# Patient Record
Sex: Female | Born: 1950
Health system: Southern US, Community
[De-identification: ages and names within clinical notes are randomized; demographics above are authoritative.]

## PROBLEM LIST (undated history)

## (undated) DIAGNOSIS — J449 Chronic obstructive pulmonary disease, unspecified: Secondary | ICD-10-CM

## (undated) DIAGNOSIS — I10 Essential (primary) hypertension: Secondary | ICD-10-CM

## (undated) DIAGNOSIS — E119 Type 2 diabetes mellitus without complications: Secondary | ICD-10-CM

## (undated) DIAGNOSIS — I4819 Other persistent atrial fibrillation: Secondary | ICD-10-CM

## (undated) DIAGNOSIS — D649 Anemia, unspecified: Secondary | ICD-10-CM

## (undated) DIAGNOSIS — I219 Acute myocardial infarction, unspecified: Secondary | ICD-10-CM

## (undated) DIAGNOSIS — I5042 Chronic combined systolic (congestive) and diastolic (congestive) heart failure: Secondary | ICD-10-CM

## (undated) DIAGNOSIS — F419 Anxiety disorder, unspecified: Secondary | ICD-10-CM

## (undated) DIAGNOSIS — K219 Gastro-esophageal reflux disease without esophagitis: Secondary | ICD-10-CM

## (undated) DIAGNOSIS — I2510 Atherosclerotic heart disease of native coronary artery without angina pectoris: Secondary | ICD-10-CM

## (undated) DIAGNOSIS — I639 Cerebral infarction, unspecified: Secondary | ICD-10-CM

## (undated) HISTORY — PX: NM MYOVIEW LTD: HXRAD82

## (undated) HISTORY — DX: Chronic obstructive pulmonary disease, unspecified: J44.9

## (undated) HISTORY — DX: Other persistent atrial fibrillation: I48.19

## (undated) HISTORY — PX: CARDIAC CATHETERIZATION: SHX172

## (undated) HISTORY — DX: Acute myocardial infarction, unspecified: I21.9

## (undated) HISTORY — DX: Gastro-esophageal reflux disease without esophagitis: K21.9

## (undated) HISTORY — DX: Essential (primary) hypertension: I10

## (undated) HISTORY — DX: Anemia, unspecified: D64.9

## (undated) HISTORY — DX: Anxiety disorder, unspecified: F41.9

## (undated) HISTORY — PX: OTHER SURGICAL HISTORY: SHX169

## (undated) HISTORY — DX: Cerebral infarction, unspecified: I63.9

## (undated) HISTORY — DX: Atherosclerotic heart disease of native coronary artery without angina pectoris: I25.10

## (undated) HISTORY — DX: Type 2 diabetes mellitus without complications: E11.9

## (undated) HISTORY — DX: Chronic combined systolic (congestive) and diastolic (congestive) heart failure: I50.42

---

## 2004-03-23 DIAGNOSIS — I2510 Atherosclerotic heart disease of native coronary artery without angina pectoris: Secondary | ICD-10-CM

## 2004-03-23 DIAGNOSIS — I639 Cerebral infarction, unspecified: Secondary | ICD-10-CM

## 2004-03-23 DIAGNOSIS — I219 Acute myocardial infarction, unspecified: Secondary | ICD-10-CM

## 2004-03-23 HISTORY — DX: Cerebral infarction, unspecified: I63.9

## 2004-03-23 HISTORY — DX: Atherosclerotic heart disease of native coronary artery without angina pectoris: I25.10

## 2004-03-23 HISTORY — DX: Acute myocardial infarction, unspecified: I21.9

## 2004-08-14 ENCOUNTER — Other Ambulatory Visit: Payer: Self-pay

## 2004-08-15 ENCOUNTER — Inpatient Hospital Stay: Payer: Self-pay | Admitting: Internal Medicine

## 2004-08-19 ENCOUNTER — Other Ambulatory Visit: Payer: Self-pay

## 2004-08-20 ENCOUNTER — Other Ambulatory Visit: Payer: Self-pay

## 2005-02-05 ENCOUNTER — Other Ambulatory Visit: Payer: Self-pay

## 2005-02-06 ENCOUNTER — Inpatient Hospital Stay: Payer: Self-pay | Admitting: Internal Medicine

## 2005-02-06 ENCOUNTER — Other Ambulatory Visit: Payer: Self-pay

## 2005-02-10 HISTORY — PX: CORONARY ANGIOPLASTY WITH STENT PLACEMENT: SHX49

## 2005-10-13 ENCOUNTER — Ambulatory Visit: Payer: Self-pay

## 2005-11-18 ENCOUNTER — Ambulatory Visit: Payer: Self-pay | Admitting: Vascular Surgery

## 2005-11-25 ENCOUNTER — Ambulatory Visit: Payer: Self-pay | Admitting: Gastroenterology

## 2006-01-21 DIAGNOSIS — D126 Benign neoplasm of colon, unspecified: Secondary | ICD-10-CM | POA: Insufficient documentation

## 2006-02-14 ENCOUNTER — Other Ambulatory Visit: Payer: Self-pay

## 2006-02-15 ENCOUNTER — Inpatient Hospital Stay: Payer: Self-pay | Admitting: Internal Medicine

## 2006-02-16 ENCOUNTER — Other Ambulatory Visit: Payer: Self-pay

## 2006-02-18 LAB — HM COLONOSCOPY

## 2011-08-22 DIAGNOSIS — N289 Disorder of kidney and ureter, unspecified: Secondary | ICD-10-CM | POA: Insufficient documentation

## 2011-09-11 ENCOUNTER — Ambulatory Visit: Payer: Self-pay | Admitting: Family Medicine

## 2011-09-18 ENCOUNTER — Ambulatory Visit: Payer: Self-pay | Admitting: Family Medicine

## 2013-10-27 LAB — BASIC METABOLIC PANEL
BUN: 21 mg/dL (ref 4–21)
CREATININE: 1.4 mg/dL — AB (ref 0.5–1.1)
Glucose: 119 mg/dL
POTASSIUM: 5 mmol/L (ref 3.4–5.3)
Sodium: 141 mmol/L (ref 137–147)

## 2013-10-27 LAB — CBC AND DIFFERENTIAL
HCT: 43 % (ref 36–46)
HEMOGLOBIN: 13.9 g/dL (ref 12.0–16.0)
Platelets: 192 10*3/uL (ref 150–399)
WBC: 8.8 10*3/mL

## 2013-10-27 LAB — HEPATIC FUNCTION PANEL
ALT: 16 U/L (ref 7–35)
AST: 16 U/L (ref 13–35)

## 2013-10-27 LAB — LIPID PANEL
CHOLESTEROL: 203 mg/dL — AB (ref 0–200)
HDL: 32 mg/dL — AB (ref 35–70)
LDL Cholesterol: 131 mg/dL
TRIGLYCERIDES: 200 mg/dL — AB (ref 40–160)

## 2013-10-27 LAB — HEMOGLOBIN A1C: Hgb A1c MFr Bld: 5.9 % (ref 4.0–6.0)

## 2013-10-27 LAB — TSH: TSH: 1.46 u[IU]/mL (ref 0.41–5.90)

## 2013-11-14 ENCOUNTER — Encounter: Payer: Self-pay | Admitting: Cardiovascular Disease

## 2013-11-14 ENCOUNTER — Ambulatory Visit (INDEPENDENT_AMBULATORY_CARE_PROVIDER_SITE_OTHER): Payer: BC Managed Care – PPO | Admitting: Cardiovascular Disease

## 2013-11-14 ENCOUNTER — Encounter (INDEPENDENT_AMBULATORY_CARE_PROVIDER_SITE_OTHER): Payer: Self-pay

## 2013-11-14 VITALS — BP 122/92 | HR 94 | Ht 64.0 in | Wt 291.5 lb

## 2013-11-14 DIAGNOSIS — R600 Localized edema: Secondary | ICD-10-CM

## 2013-11-14 DIAGNOSIS — I2510 Atherosclerotic heart disease of native coronary artery without angina pectoris: Secondary | ICD-10-CM | POA: Insufficient documentation

## 2013-11-14 DIAGNOSIS — I10 Essential (primary) hypertension: Secondary | ICD-10-CM

## 2013-11-14 DIAGNOSIS — R609 Edema, unspecified: Secondary | ICD-10-CM

## 2013-11-14 DIAGNOSIS — F17200 Nicotine dependence, unspecified, uncomplicated: Secondary | ICD-10-CM

## 2013-11-14 DIAGNOSIS — R7989 Other specified abnormal findings of blood chemistry: Secondary | ICD-10-CM

## 2013-11-14 DIAGNOSIS — R799 Abnormal finding of blood chemistry, unspecified: Secondary | ICD-10-CM

## 2013-11-14 DIAGNOSIS — R0602 Shortness of breath: Secondary | ICD-10-CM | POA: Insufficient documentation

## 2013-11-14 DIAGNOSIS — R7309 Other abnormal glucose: Secondary | ICD-10-CM

## 2013-11-14 DIAGNOSIS — R5383 Other fatigue: Secondary | ICD-10-CM

## 2013-11-14 DIAGNOSIS — R5381 Other malaise: Secondary | ICD-10-CM

## 2013-11-14 DIAGNOSIS — R7303 Prediabetes: Secondary | ICD-10-CM

## 2013-11-14 NOTE — Assessment & Plan Note (Signed)
Recommended weight loss, careful diet monitoring

## 2013-11-14 NOTE — Progress Notes (Signed)
Patient ID: Crystal Pacheco, female    DOB: Dec 08, 1950, 63 y.o.   MRN: [id number]  HPI Comments: Crystal Pacheco is a pleasant 63 year old woman with a history of coronary artery disease, drug-eluting stent/Taxus placed in 2006 to an OM vessel, also with 100% proximal occluded RCA. Long prior history of smoking who continues to smoke an occasional cigarette, morbid obesity, hyperlipidemia, who presents for new patient evaluation. She reports having worsening lower extremity edema in the past year, was told that she had elevated BNP.  She states that starting December 2014, she started having more leg swelling. She spends long hours in her car driving to the beach. Once at the beach, she sits in a chair and reads. She is relatively inactive, does not do any regular exercise. Her weight has been trending upwards.  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 During this time, she gained weight, was very sedentary, sat most of the time. She does not like to wear compression hose.  She's been taking Lasix 40 mg in the morning. After her trips to the beach when she has worsening leg swelling, she will take extra Lasix. This does not seem to help her symptoms. Recent creatinine 1.3 she reports that she was recently seen by her previous cardiologist and was changed from enalapril to lisinopril, metoprolol dose was decreased down to 25 mg twice a day from 50 mg twice a day.  She's not been taking her Lipitor on a regular basis  Previous [facility] notes that detailed her cardiac catheterization mentioned a pseudoaneurysm on the right groin. She reports that she was seen by vascular surgery in followup. No surgery was needed.  Recent lab work showing total cholesterol 203, LDL 131, HDL 32, BNP 376 EKG today showed normal sinus rhythm with no significant ST or T wave changes     Outpatient Encounter Prescriptions as of 11/14/2013  Medication Sig   . ALPRAZolam (NIRAVAM) 0.25 MG dissolvable tablet Take 0.25 mg by mouth 2 (two) times daily as needed for anxiety.  Marland Kitchen aspirin 81 MG tablet Take 81 mg by mouth daily.  Marland Kitchen atorvastatin (LIPITOR) 80 MG tablet Take 80 mg by mouth daily.  . enalapril (VASOTEC) 5 MG tablet Take 5 mg by mouth daily.  . furosemide (LASIX) 20 MG tablet Take 40 mg by mouth daily.   Marland Kitchen lisinopril (PRINIVIL,ZESTRIL) 10 MG tablet Take 10 mg by mouth daily.  . metoprolol (LOPRESSOR) 50 MG tablet Take 25 mg by mouth 2 (two) times daily.   . nitroGLYCERIN (NITROSTAT) 0.4 MG SL tablet Place 0.4 mg under the tongue every 5 (five) minutes as needed for chest pain.  Marland Kitchen omeprazole (PRILOSEC) 20 MG capsule Take 20 mg by mouth 2 (two) times daily before a meal.     Review of Systems  Constitutional: Positive for unexpected weight change.  HENT: Negative.   Eyes: Negative.   Respiratory: Positive for shortness of breath.   Cardiovascular: Positive for leg swelling.  Gastrointestinal: Negative.   Endocrine: Negative.   Musculoskeletal: Negative.   Skin: Negative.   Allergic/Immunologic: Negative.   Neurological: Negative.   Hematological: Negative.   Psychiatric/Behavioral: Negative.   All other systems reviewed and are negative.   BP 122/92  Pulse 94  Ht 5\' 4"  (1.626 m)  Wt 291 lb 8 oz (132.224 kg)  BMI 50.01 kg/m2  Physical Exam  Nursing note and vitals reviewed. Constitutional: She is  oriented to person, place, and time. She appears well-developed and well-nourished.  Morbid obesity  HENT:  Head: Normocephalic.  Nose: Nose normal.  Mouth/Throat: Oropharynx is clear and moist.  Eyes: Conjunctivae are normal. Pupils are equal, round, and reactive to light.  Neck: Normal range of motion. Neck supple. No JVD present.  Cardiovascular: Normal rate, regular rhythm, S1 normal, S2 normal, normal heart sounds and intact distal pulses.  Exam reveals no gallop and no friction rub.   No murmur heard. Nonpitting edema   Pulmonary/Chest: Effort normal and breath sounds normal. No respiratory distress. She has no wheezes. She has no rales. She exhibits no tenderness.  Abdominal: Soft. Bowel sounds are normal. She exhibits no distension. There is no tenderness.  Musculoskeletal: Normal range of motion. She exhibits no edema and no tenderness.  Lymphadenopathy:    She has no cervical adenopathy.  Neurological: She is alert and oriented to person, place, and time. Coordination normal.  Skin: Skin is warm and dry. No rash noted. No erythema.  Psychiatric: She has a normal mood and affect. Her behavior is normal. Judgment and thought content normal.    Assessment and Plan

## 2013-11-14 NOTE — Assessment & Plan Note (Signed)
Blood pressure is well controlled on today's visit. No changes made to the medications. 

## 2013-11-14 NOTE — Assessment & Plan Note (Signed)
We have encouraged continued exercise, careful diet management in an effort to lose weight. 

## 2013-11-14 NOTE — Assessment & Plan Note (Signed)
We have encouraged her to continue to work on weaning her cigarettes and smoking cessation. She will continue to work on this and does not want any assistance with chantix.  

## 2013-11-14 NOTE — Assessment & Plan Note (Signed)
We have discussed her elevated BNP with her. I suspect it is mildly elevated in the setting of mild underlying lung disease/COPD. Echocardiogram has been ordered as she's not had one in the past several years. This will also help to determine the etiology of her leg swelling. We'll continue Lasix 40 mg daily for now

## 2013-11-14 NOTE — Patient Instructions (Signed)
You are doing well.  You have leg swelling which is likely from vein "leaking" Chronic Venous Insufficiency  Please come in for an echocardiogram for leg swelling Please wear compression hose as tolerated Please take atorvastatin/lipitors daily for cholesterol  Please call us if you have new issues that need to be addressed before your next appt.  Your physician wants you to follow-up in: 3 months.  You will receive a reminder letter in the mail two months in advance. If you don't receive a letter, please call our office to schedule the follow-up appointment.

## 2013-11-14 NOTE — Assessment & Plan Note (Signed)
We had a long discussion about her leg swelling. I suspect this is secondary to chronic venous insufficiency. She is sedentary, sits with her legs down, does not like to wear compression hose. Renal function is 1.3 and we have expressed the concern with her that she does not become dehydrated with extra diuretic. Recommended leg elevation, compression hose. Echo pending

## 2013-11-14 NOTE — Assessment & Plan Note (Addendum)
Records were requested from [facility] and reviewed . Currently with no symptoms of angina. No further workup at this time. Continue current medication regimen. No recent stress test available. Given her body habitus, would proceed with cardiac catheterization if she has shortness of breath or chest discomfort.

## 2013-11-14 NOTE — Assessment & Plan Note (Signed)
Mild shortness of breath which is a chronic issue likely secondary to continued smoking, mild COPD, deconditioning and morbid obesity. If symptoms get worse, could consider ischemia workup or catheterization given her size

## 2013-12-26 ENCOUNTER — Other Ambulatory Visit: Payer: BC Managed Care – PPO

## 2014-02-20 ENCOUNTER — Ambulatory Visit: Payer: BC Managed Care – PPO | Admitting: Cardiovascular Disease

## 2014-10-03 ENCOUNTER — Other Ambulatory Visit: Payer: Self-pay | Admitting: Family Medicine

## 2014-10-03 NOTE — Telephone Encounter (Signed)
Pt contacted office for refill request on the following medications: Furosemide 20 mg to [facility]. Pt stated that she takes 2 a day and she usually gets 3 months supply but when it was sent in May 2016 it was only for 90 pills. Pt stated she is about out and is leaving for the beach. I advised pt needs an OV she stated she isn't due to come in until August and she would make the appt for August later that she wasn't making it today. Thanks TNP

## 2014-10-30 ENCOUNTER — Other Ambulatory Visit: Payer: Self-pay | Admitting: Family Medicine

## 2014-10-30 ENCOUNTER — Telehealth: Payer: Self-pay | Admitting: Family Medicine

## 2014-10-30 MED ORDER — METOPROLOL TARTRATE 50 MG PO TABS: 25.0000 mg | ORAL_TABLET | Freq: Two times a day (BID) | ORAL | Status: DC

## 2014-10-30 MED ORDER — LISINOPRIL 10 MG PO TABS: 10.0000 mg | ORAL_TABLET | Freq: Every day | ORAL | Status: DC

## 2014-10-30 NOTE — Telephone Encounter (Signed)
Please review. Thanks!  

## 2014-10-30 NOTE — Telephone Encounter (Signed)
lisinopril (PRINIVIL,ZESTRIL) 10 MG tablet metoprolol (LOPRESSOR) 50 MG tablet [facility] pharmacy

## 2014-11-09 ENCOUNTER — Ambulatory Visit (INDEPENDENT_AMBULATORY_CARE_PROVIDER_SITE_OTHER): Payer: Medicare Other | Admitting: Family Medicine

## 2014-11-09 ENCOUNTER — Encounter: Payer: Self-pay | Admitting: Family Medicine

## 2014-11-09 VITALS — BP 116/70 | HR 69 | Temp 98.7°F | Resp 16 | Ht 61.16 in | Wt 298.0 lb

## 2014-11-09 DIAGNOSIS — E785 Hyperlipidemia, unspecified: Secondary | ICD-10-CM | POA: Diagnosis not present

## 2014-11-09 DIAGNOSIS — K219 Gastro-esophageal reflux disease without esophagitis: Secondary | ICD-10-CM | POA: Diagnosis not present

## 2014-11-09 DIAGNOSIS — Z8619 Personal history of other infectious and parasitic diseases: Secondary | ICD-10-CM | POA: Insufficient documentation

## 2014-11-09 DIAGNOSIS — R7309 Other abnormal glucose: Secondary | ICD-10-CM | POA: Diagnosis not present

## 2014-11-09 DIAGNOSIS — R0602 Shortness of breath: Secondary | ICD-10-CM | POA: Insufficient documentation

## 2014-11-09 DIAGNOSIS — IMO0002 Reserved for concepts with insufficient information to code with codable children: Secondary | ICD-10-CM | POA: Insufficient documentation

## 2014-11-09 DIAGNOSIS — I6350 Cerebral infarction due to unspecified occlusion or stenosis of unspecified cerebral artery: Secondary | ICD-10-CM | POA: Insufficient documentation

## 2014-11-09 DIAGNOSIS — I10 Essential (primary) hypertension: Secondary | ICD-10-CM | POA: Diagnosis not present

## 2014-11-09 DIAGNOSIS — F419 Anxiety disorder, unspecified: Secondary | ICD-10-CM | POA: Diagnosis not present

## 2014-11-09 DIAGNOSIS — Z23 Encounter for immunization: Secondary | ICD-10-CM | POA: Diagnosis not present

## 2014-11-09 DIAGNOSIS — K922 Gastrointestinal hemorrhage, unspecified: Secondary | ICD-10-CM | POA: Insufficient documentation

## 2014-11-09 DIAGNOSIS — Z862 Personal history of diseases of the blood and blood-forming organs and certain disorders involving the immune mechanism: Secondary | ICD-10-CM | POA: Insufficient documentation

## 2014-11-09 DIAGNOSIS — D126 Benign neoplasm of colon, unspecified: Secondary | ICD-10-CM

## 2014-11-09 DIAGNOSIS — Z87898 Personal history of other specified conditions: Secondary | ICD-10-CM | POA: Insufficient documentation

## 2014-11-09 DIAGNOSIS — D649 Anemia, unspecified: Secondary | ICD-10-CM | POA: Insufficient documentation

## 2014-11-09 DIAGNOSIS — R7303 Prediabetes: Secondary | ICD-10-CM

## 2014-11-09 DIAGNOSIS — I724 Aneurysm of artery of lower extremity: Secondary | ICD-10-CM | POA: Insufficient documentation

## 2014-11-09 DIAGNOSIS — F411 Generalized anxiety disorder: Secondary | ICD-10-CM | POA: Insufficient documentation

## 2014-11-09 DIAGNOSIS — J449 Chronic obstructive pulmonary disease, unspecified: Secondary | ICD-10-CM | POA: Insufficient documentation

## 2014-11-09 DIAGNOSIS — Z0000 Encounter for general adult medical examination without abnormal findings: Secondary | ICD-10-CM

## 2014-11-09 DIAGNOSIS — I2510 Atherosclerotic heart disease of native coronary artery without angina pectoris: Secondary | ICD-10-CM | POA: Insufficient documentation

## 2014-11-09 DIAGNOSIS — I509 Heart failure, unspecified: Secondary | ICD-10-CM | POA: Insufficient documentation

## 2014-11-09 DIAGNOSIS — R609 Edema, unspecified: Secondary | ICD-10-CM | POA: Insufficient documentation

## 2014-11-09 MED ORDER — METOPROLOL TARTRATE 25 MG PO TABS: 25.0000 mg | ORAL_TABLET | Freq: Two times a day (BID) | ORAL | Status: DC

## 2014-11-09 MED ORDER — ALPRAZOLAM 0.25 MG PO TABS: ORAL_TABLET | ORAL | Status: DC

## 2014-11-09 NOTE — Patient Instructions (Addendum)
   You have a high risk for colon cancer and need a colonoscopy as soon as possible. Please call our office ASAP for a referral.

## 2014-11-09 NOTE — Addendum Note (Signed)
Addended by: Julieta Bellini on: 11/09/2014 11:47 AM   Modules accepted: Orders

## 2014-11-09 NOTE — Progress Notes (Signed)
Patient: Crystal Pacheco, Female    DOB: December 04, 1950, 64 y.o.   MRN: [id number] Visit Date: 11/09/2014  Today's Provider: Lelon Huh, MD   Chief Complaint  Patient presents with  . Medicare Wellness  . Hypertension  . Hyperlipidemia  . Anemia  . Leg Swelling   Subjective:    Annual wellness visit Crystal Pacheco is a 64 y.o. female. She feels well. She reports exercising none. She reports she is sleeping well.  Follow-up for CAD and Edema from 10/27/2013; patient was advised to follow-up with cardiologist.    Hypertension, follow-up:  BP Readings from Last 3 Encounters:  11/09/14 116/70  11/14/13 122/92    She was last seen for hypertension 1 years ago.  BP at that visit was 140/80 Management changes since that visit includeChanged enalapril 5 mg to lisinopril 10 mg, which is better for legs swelling.She reports good compliance with treatment. She is not having side effects. none  She is not exercising. She is adherent to low salt diet.   Outside blood pressures are 120/40. She is experiencing none.  Patient denies none.   Cardiovascular risk factors include none.  Use of agents associated with hypertension: none.   ----------------------------------------------------------------------    Lipid/Cholesterol, Follow-up:   Last seen for this 1 years ago.  Management changes since that visit include none.  Last Lipid Panel:    Component Value Date/Time   CHOL 203* 10/27/2013   TRIG 200* 10/27/2013   HDL 32* 10/27/2013   LDLCALC 131 10/27/2013    She reports good compliance with treatment. She is not having side effects. none  Wt Readings from Last 3 Encounters:  11/09/14 298 lb (135.172 kg)  11/14/13 291 lb 8 oz (132.224 kg)    -----------------------------------------------------------------------   Review of Systems  Constitutional: Negative.  Negative for fever, chills, appetite change and fatigue.  HENT: Negative.  Negative for  congestion, ear pain, rhinorrhea, sneezing and sore throat.   Eyes: Negative.  Negative for pain and redness.  Respiratory: Negative.  Negative for cough, chest tightness, shortness of breath and wheezing.   Cardiovascular: Positive for leg swelling. Negative for chest pain and palpitations.  Gastrointestinal: Negative.  Negative for nausea, vomiting, abdominal pain, diarrhea, constipation and blood in stool.  Endocrine: Negative.  Negative for polydipsia and polyphagia.  Genitourinary: Negative.  Negative for dysuria, hematuria, flank pain, vaginal bleeding, vaginal discharge and pelvic pain.  Musculoskeletal: Negative.  Negative for back pain, joint swelling, arthralgias and gait problem.  Skin: Negative.  Negative for rash.  Allergic/Immunologic: Negative.   Neurological: Negative.  Negative for dizziness, tremors, seizures, weakness, light-headedness, numbness and headaches.  Hematological: Negative.  Negative for adenopathy.  Psychiatric/Behavioral: Negative.  Negative for behavioral problems, confusion and dysphoric mood. The patient is not nervous/anxious and is not hyperactive.     Social History   Social History  . Marital Status: Married    Spouse Name: N/A  . Number of Children: 1  . Years of Education: N/A   Occupational History  . Retired    Social History Main Topics  . Smoking status: Former Smoker -- 1.00 packs/day for 20 years    Types: Cigarettes    Quit date: 11/15/2007  . Smokeless tobacco: Not on file  . Alcohol Use: 1.0 oz/week    2 Standard drinks or equivalent per week  . Drug Use: No  . Sexual Activity: Not on file   Other Topics Concern  . Not on file  Social History Narrative    Patient Active Problem List   Diagnosis Date Noted  . Absolute anemia 11/09/2014  . Anxiety 11/09/2014  . Atherosclerosis of coronary artery 11/09/2014  . Congestive heart failure 11/09/2014  . History of measles, mumps, or rubella 11/09/2014  . Chronic airway  obstruction 11/09/2014  . Cerebral artery occlusion with cerebral infarction 11/09/2014  . Breath shortness 11/09/2014  . Accumulation of fluid in tissues 11/09/2014  . Aneurysm of artery of lower extremity 11/09/2014  . Anxiety, generalized 11/09/2014  . Esophageal reflux 11/09/2014  . Hemorrhage of gastrointestinal tract 11/09/2014  . History of disease of blood and blood-forming organs 11/09/2014  . HLD (hyperlipidemia) 11/09/2014  . BP (high blood pressure) 11/09/2014  . Laceration of perineum 11/09/2014  . Coronary atherosclerosis of unspecified type of vessel, native or graft 11/14/2013  . Other fatigue 11/14/2013  . Bilateral edema of lower extremity 11/14/2013  . Morbid obesity 11/14/2013  . Shortness of breath 11/14/2013  . Essential hypertension 11/14/2013  . Prediabetes 11/14/2013  . Elevated brain natriuretic peptide (BNP) level 11/14/2013  . Smoker 11/14/2013  . Disorder of kidney 08/22/2011  . Tubular adenoma of colon 01/21/2006    Past Surgical History  Procedure Laterality Date  . Nm myoview ltd      Myoview EF= 42% High lateral wall defect   . Cardiac catheterization    . Coronary angioplasty with stent placement  02/10/2005    Paclitaxel Eluting Taxus Express 2 Monorail 2.75 x 45mm left Cx-marg.  . Carotid surgery stent      Her Family history is unknown by patient.    Previous Medications   ALPRAZOLAM (NIRAVAM) 0.25 MG DISSOLVABLE TABLET    Take 0.25 mg by mouth 2 (two) times daily as needed for anxiety.   ASPIRIN 81 MG TABLET    Take 81 mg by mouth daily.   ATORVASTATIN (LIPITOR) 80 MG TABLET    Take 80 mg by mouth daily.   ENALAPRIL (VASOTEC) 5 MG TABLET    Take 5 mg by mouth daily.   FUROSEMIDE (LASIX) 20 MG TABLET    TAKE ONE OR TWO TABLETS DAILY   LISINOPRIL (PRINIVIL,ZESTRIL) 10 MG TABLET    Take 1 tablet (10 mg total) by mouth daily.   METOPROLOL (LOPRESSOR) 50 MG TABLET    Take 0.5 tablets (25 mg total) by mouth 2 (two) times daily.    NITROGLYCERIN (NITROSTAT) 0.4 MG SL TABLET    Place 0.4 mg under the tongue every 5 (five) minutes as needed for chest pain.   OMEPRAZOLE (PRILOSEC) 20 MG CAPSULE    Take 20 mg by mouth 2 (two) times daily before a meal.    Patient Care Team: Birdie Sons, MD as PCP - General (Family Medicine)     Objective:   Vitals: BP 116/70 mmHg  Pulse 69  Temp(Src) 98.7 F (37.1 C) (Oral)  Resp 16  Ht 5' 1.16" (1.553 m)  Wt 298 lb (135.172 kg)  BMI 56.05 kg/m2  SpO2 94%  Physical Exam   General Appearance:    Alert, cooperative, no distress, appears stated age, obese  Head:    Normocephalic, without obvious abnormality, atraumatic  Eyes:    PERRL, conjunctiva/corneas clear, EOM's intact, fundi    benign, both eyes  Ears:    Normal TM's and external ear canals, both ears  Nose:   Nares normal, septum midline, mucosa normal, no drainage    or sinus tenderness  Throat:   Lips, mucosa, and  tongue normal; teeth and gums normal  Neck:   Supple, symmetrical, trachea midline, no adenopathy;    thyroid:  no enlargement/tenderness/nodules; no carotid   bruit or JVD  Back:     Symmetric, no curvature, ROM normal, no CVA tenderness  Lungs:     Clear to auscultation bilaterally, respirations unlabored  Chest Wall:    No tenderness or deformity   Heart:    Regular rate and rhythm, S1 and S2 normal, no murmur, rub   or gallop, 3+ LE edema  Breast Exam:    deferred  Abdomen:     Soft, non-tender, bowel sounds active all four quadrants,    no masses, no organomegaly  Pelvic:    deferred  Extremities:   Extremities normal, atraumatic, no cyanosis or edema  Pulses:   2+ and symmetric all extremities  Skin:   Skin color, texture, turgor normal, no rashes or lesions  Lymph nodes:   Cervical, supraclavicular, and axillary nodes normal  Neurologic:   CNII-XII intact, normal strength, sensation and reflexes    throughout     Activities of Daily Living In your present state of health, do you have  any difficulty performing the following activities: 11/09/2014  Hearing? N  Vision? N  Difficulty concentrating or making decisions? N  Walking or climbing stairs? N  Dressing or bathing? N  Doing errands, shopping? N    Fall Risk Assessment Fall Risk  11/09/2014  Falls in the past year? No     Depression Screen PHQ 2/9 Scores 11/09/2014  PHQ - 2 Score 0  PHQ- 9 Score 0    Cognitive Testing - 6-CIT  Correct? Score   What year is it? yes 0 0 or 4  What month is it? yes 0 0 or 3  Memorize:    Pia Mau,  41,  [address]  [place],      What time is it? (within 1 hour) yes 0 0 or 3  Count backwards from 20 yes 0 0, 2, or 4  Name the months of the year yes 0 0, 2, or 4  Repeat name & address above yes 3 0, 2, 4, 6, 8, or 10       TOTAL SCORE  3/28   Interpretation:  Normal  Normal (0-7) Abnormal (8-28)       Assessment & Plan:     Annual Physical  Reviewed patient's Family Medical History Reviewed and updated list of patient's medical providers Assessment of cognitive impairment was done Assessed patient's functional ability Established a written schedule for health screening [place] Completed and Reviewed  Exercise Activities and Dietary recommendations Goals    None      Immunization History  Administered Date(s) Administered  . Pneumococcal Polysaccharide-23 02/18/2006    Health Maintenance  Topic Date Due  . Hepatitis C Screening  04-May-1950  . HIV Screening  09/27/1964  . TETANUS/TDAP  09/27/1968  . MAMMOGRAM  09/28/1999  . ZOSTAVAX  09/27/2009  . COLONOSCOPY  02/19/2011  . DEXA SCAN  09/28/2014  . PNA vac Low Risk Adult (1 of 2 - PCV13) 09/28/2014  . INFLUENZA VACCINE  10/22/2014      Discussed health benefits of physical activity, and encouraged her to engage in regular exercise appropriate for her age and condition.      ------------------------------------------------------------------------------------------------------------ 1. Annual physical exam   2. Prediabetes  - Hemoglobin A1c  3. HLD (hyperlipidemia) She is tolerating atorvastatin well with no adverse effects.   -  Lipid panel - Hepatic function panel  4. History of disease of blood and blood-forming organs  - CBC  5. Essential hypertension well controlled Continue current medications.   - metoprolol (LOPRESSOR) 25 MG tablet; Take 1 tablet (25 mg total) by mouth 2 (two) times daily.  Dispense: 180 tablet; Refill: 4 - TSH - Renal function panel - EKG 12-Lead  6. Gastroesophageal reflux disease without esophagitis Controlled on omeprazole  7. Morbid obesity Diet and exercise  8. Tubular adenoma of colon Extensively counseled that she is at high risk for colon cancer and needs follow up colonoscopy. She refused referral today but said she will get it scheduled.   9. Anxiety  - ALPRAZolam (XANAX) 0.25 MG tablet; 1/2 - 1 tablet twice a day as needed.  Dispense: 180 tablet; Refill: 1

## 2014-11-10 LAB — CBC
HEMATOCRIT: 39 % (ref 34.0–46.6)
HEMOGLOBIN: 12.4 g/dL (ref 11.1–15.9)
MCH: 29.8 pg (ref 26.6–33.0)
MCHC: 31.8 g/dL (ref 31.5–35.7)
MCV: 94 fL (ref 79–97)
Platelets: 257 10*3/uL (ref 150–379)
RBC: 4.16 x10E6/uL (ref 3.77–5.28)
RDW: 15.7 % — ABNORMAL HIGH (ref 12.3–15.4)
WBC: 9.3 10*3/uL (ref 3.4–10.8)

## 2014-11-10 LAB — LIPID PANEL
CHOLESTEROL TOTAL: 187 mg/dL (ref 100–199)
Chol/HDL Ratio: 4.9 ratio units — ABNORMAL HIGH (ref 0.0–4.4)
HDL: 38 mg/dL — AB (ref >39)
LDL CALC: 118 mg/dL — AB (ref 0–99)
TRIGLYCERIDES: 157 mg/dL — AB (ref 0–149)
VLDL CHOLESTEROL CAL: 31 mg/dL (ref 5–40)

## 2014-11-10 LAB — HEPATIC FUNCTION PANEL
ALT: 16 IU/L (ref 0–32)
AST: 15 IU/L (ref 0–40)
Alkaline Phosphatase: 73 IU/L (ref 39–117)
Bilirubin Total: 0.4 mg/dL (ref 0.0–1.2)
Bilirubin, Direct: 0.1 mg/dL (ref 0.00–0.40)
Total Protein: 7.4 g/dL (ref 6.0–8.5)

## 2014-11-10 LAB — RENAL FUNCTION PANEL
ALBUMIN: 4.3 g/dL (ref 3.6–4.8)
BUN / CREAT RATIO: 15 (ref 11–26)
BUN: 20 mg/dL (ref 8–27)
CALCIUM: 9.5 mg/dL (ref 8.7–10.3)
CO2: 23 mmol/L (ref 18–29)
CREATININE: 1.35 mg/dL — AB (ref 0.57–1.00)
Chloride: 98 mmol/L (ref 97–108)
GFR calc Af Amer: 48 mL/min/{1.73_m2} — ABNORMAL LOW (ref >59)
GFR calc non Af Amer: 41 mL/min/{1.73_m2} — ABNORMAL LOW (ref >59)
Glucose: 128 mg/dL — ABNORMAL HIGH (ref 65–99)
PHOSPHORUS: 3 mg/dL (ref 2.5–4.5)
Potassium: 4.8 mmol/L (ref 3.5–5.2)
Sodium: 141 mmol/L (ref 134–144)

## 2014-11-10 LAB — TSH: TSH: 1.76 u[IU]/mL (ref 0.450–4.500)

## 2014-11-10 LAB — HEMOGLOBIN A1C
Est. average glucose Bld gHb Est-mCnc: 140 mg/dL
Hgb A1c MFr Bld: 6.5 % — ABNORMAL HIGH (ref 4.8–5.6)

## 2014-11-12 ENCOUNTER — Other Ambulatory Visit: Payer: Self-pay | Admitting: Family Medicine

## 2014-11-12 ENCOUNTER — Other Ambulatory Visit: Payer: Self-pay | Admitting: *Deleted

## 2014-11-12 MED ORDER — METFORMIN HCL ER 500 MG PO TB24: 500.0000 mg | ORAL_TABLET | Freq: Every day | ORAL | Status: DC

## 2014-11-12 MED ORDER — FUROSEMIDE 20 MG PO TABS: ORAL_TABLET | ORAL | Status: DC

## 2014-11-12 NOTE — Telephone Encounter (Signed)
Crystal Pacheco called wanting to make sure all her prescriptions are called in.  She goes out of town for 6 weeks at at time.    She asked that Sharyn Lull please call her back to make sure everything was call in.  [phone].

## 2014-11-14 ENCOUNTER — Other Ambulatory Visit: Payer: Self-pay

## 2014-11-14 ENCOUNTER — Ambulatory Visit: Payer: Medicare Other

## 2014-11-14 DIAGNOSIS — R7303 Prediabetes: Secondary | ICD-10-CM

## 2014-11-14 MED ORDER — GLUCOSE BLOOD VI STRP
ORAL_STRIP | Status: AC
Start: 2014-11-14 — End: ?

## 2014-11-14 NOTE — Progress Notes (Signed)
Demonstrated pt how to use a glucometer(one touch, verio flex), pt demonstrated back how to use it. Advised pt to write down her glucose reading daily. Pt verbalized fully understanding. Given a one touch glucometer from [facility]. Given information regarding taking diabetes class at [facility] at [facility].  Thanks,  Pt wants to know if she can a a prescription for the test strips for the one touch vario flex glucometer.

## 2014-11-14 NOTE — Telephone Encounter (Signed)
Pt came in to office for diabetes education today, gave a glucometer One Touch Verio Flex to pt.  Thanks,

## 2015-01-04 ENCOUNTER — Other Ambulatory Visit: Payer: Self-pay | Admitting: *Deleted

## 2015-01-04 ENCOUNTER — Other Ambulatory Visit: Payer: Self-pay | Admitting: Family Medicine

## 2015-01-04 MED ORDER — LISINOPRIL 10 MG PO TABS: 10.0000 mg | ORAL_TABLET | Freq: Every day | ORAL | Status: DC

## 2015-01-04 NOTE — Telephone Encounter (Addendum)
Per Dr. Caryn Section okay to do 90 day if patient schedules 3 month follow-up appt. Patient agreed and scheduled f/u appt for 02/26/2015.

## 2015-01-04 NOTE — Telephone Encounter (Signed)
Pt is requesting a 90 day supply resent for Rx lisinopril (PRINIVIL,ZESTRIL) 10 MG.  [place].  LK#[phone]/QD

## 2015-01-04 NOTE — Telephone Encounter (Signed)
Resent rx for 90 day refill.

## 2015-02-12 ENCOUNTER — Other Ambulatory Visit: Payer: Self-pay | Admitting: Family Medicine

## 2015-02-12 MED ORDER — METFORMIN HCL ER 500 MG PO TB24: 500.0000 mg | ORAL_TABLET | Freq: Every day | ORAL | Status: DC

## 2015-02-12 NOTE — Telephone Encounter (Signed)
Pt called for refill metFORMIN (GLUCOPHAGE-XR) 500 MG 24 hr tablet  [place]  CB# [phone]  Thanks Con Memos

## 2015-02-18 ENCOUNTER — Other Ambulatory Visit: Payer: Self-pay | Admitting: Family Medicine

## 2015-02-18 DIAGNOSIS — R7303 Prediabetes: Secondary | ICD-10-CM

## 2015-02-18 DIAGNOSIS — E119 Type 2 diabetes mellitus without complications: Secondary | ICD-10-CM

## 2015-02-18 NOTE — Telephone Encounter (Signed)
Pt called needs strips and needles for the Contour Next EZ.  She uses [employer] back [phone]  Thank sTeri

## 2015-02-19 NOTE — Telephone Encounter (Signed)
Called in rx to pharmacy. Pharmacy is requesting dx code. Dx is not in pt's problem list. Please advise?

## 2015-02-19 NOTE — Telephone Encounter (Signed)
Please call in 100 strips and 100 needles to use once a day, rf x 4

## 2015-02-20 DIAGNOSIS — E1121 Type 2 diabetes mellitus with diabetic nephropathy: Secondary | ICD-10-CM | POA: Insufficient documentation

## 2015-02-20 NOTE — Telephone Encounter (Signed)
Pharmacy was notified.

## 2015-02-20 NOTE — Telephone Encounter (Signed)
Type 2 diabetes (E11.9) non-insulin requiring.

## 2015-02-26 ENCOUNTER — Encounter: Payer: Self-pay | Admitting: Family Medicine

## 2015-02-26 ENCOUNTER — Ambulatory Visit (INDEPENDENT_AMBULATORY_CARE_PROVIDER_SITE_OTHER): Payer: Medicare Other | Admitting: Family Medicine

## 2015-02-26 VITALS — BP 118/58 | HR 74 | Temp 98.7°F | Resp 16 | Ht 61.16 in | Wt 288.0 lb

## 2015-02-26 DIAGNOSIS — E119 Type 2 diabetes mellitus without complications: Secondary | ICD-10-CM

## 2015-02-26 LAB — POCT GLYCOSYLATED HEMOGLOBIN (HGB A1C)
ESTIMATED AVERAGE GLUCOSE: 123
HEMOGLOBIN A1C: 5.9

## 2015-02-26 NOTE — Patient Instructions (Signed)
Check sugar once a week. Call if sugars starting running over 150.

## 2015-02-26 NOTE — Progress Notes (Signed)
Patient: Crystal Pacheco Female    DOB: Jan 23, 1951   64 y.o.   MRN: [id number] Visit Date: 02/26/2015  Today's Provider: Lelon Huh, MD   Chief Complaint  Patient presents with  . Follow-up  . Diabetes  . Hypertension   Subjective:    HPI   Diabetes Mellitus Type II, Follow-up:   Lab Results  Component Value Date   HGBA1C 6.5* 11/09/2014   HGBA1C 5.9 10/27/2013   Last seen for diabetes 4 months ago.  Management since then includes; started metformin ER 500mg  qd. She reports good compliance with treatment. She is not having side effects. Stomach upset Current symptoms include none and have been up and down. Home blood sugar records: fasting range: 110-140  Episodes of hypoglycemia? no   Current Insulin Regimen: n/a Most Recent Eye Exam: n/a Weight trend: decreasing steadily Prior visit with dietician: no Current diet: in general, a "healthy" diet   Current exercise: none  She does get nauseated from time to time since starting metforming. Hs been much more strict with diet, rarely consumes sweets.  ----------------------------------------------------------------------   Hypertension, follow-up:  BP Readings from Last 3 Encounters:  02/26/15 118/58  11/09/14 116/70  11/14/13 122/92    She was last seen for hypertension 4 months ago.  BP at that visit was 116/70. Management since that visit includes; no changes were made  .She reports good compliance with treatment. She is not having side effects. none  She is not exercising. She is not adherent to low salt diet.   Outside blood pressures are n/a. She is experiencing none.  Patient denies none.   Cardiovascular risk factors include diabetes mellitus.  Use of agents associated with hypertension: none.   ----------------------------------------------------------------------   Wt Readings from Last 3 Encounters:  02/26/15 288 lb (130.636 kg)  11/09/14 298 lb (135.172 kg)  11/14/13 291 lb 8 oz  (132.224 kg)      No Known Allergies Previous Medications   ALPRAZOLAM (XANAX) 0.25 MG TABLET    1/2 - 1 tablet twice a day as needed.   ASPIRIN 81 MG TABLET    Take 81 mg by mouth daily.   ATORVASTATIN (LIPITOR) 80 MG TABLET    TAKE 1 TABLET EVERY DAY   ENALAPRIL (VASOTEC) 5 MG TABLET    Take 5 mg by mouth daily.   FUROSEMIDE (LASIX) 20 MG TABLET    TAKE ONE OR TWO TABLETS DAILY   GLUCOSE BLOOD TEST STRIP    Use as instructed   LISINOPRIL (PRINIVIL,ZESTRIL) 10 MG TABLET    Take 1 tablet (10 mg total) by mouth daily.   METFORMIN (GLUCOPHAGE-XR) 500 MG 24 HR TABLET    Take 1 tablet (500 mg total) by mouth daily with breakfast.   METOPROLOL (LOPRESSOR) 25 MG TABLET    Take 1 tablet (25 mg total) by mouth 2 (two) times daily.   NITROSTAT 0.4 MG SL TABLET    DISSOLVE 1 TABLET UNDER TONGUE EVERY 3 TO 5 MINUTES AS NEEDED FOR CHEST PAIN   OMEPRAZOLE (PRILOSEC) 20 MG CAPSULE    TAKE ONE CAPSULE TWICE A DAY    Review of Systems  Constitutional: Negative for fever, chills, appetite change and fatigue.  Respiratory: Negative for chest tightness and shortness of breath.   Cardiovascular: Negative for chest pain and palpitations.  Gastrointestinal: Negative for nausea, vomiting and abdominal pain.  Neurological: Negative for dizziness, weakness, light-headedness and headaches.    Social History  Substance Use Topics  .  Smoking status: Former Smoker -- 1.00 packs/day for 20 years    Types: Cigarettes    Quit date: 11/15/2007  . Smokeless tobacco: Not on file  . Alcohol Use: 1.0 oz/week    2 Standard drinks or equivalent per week   Objective:   BP 118/58 mmHg  Pulse 74  Temp(Src) 98.7 F (37.1 C) (Oral)  Resp 16  Ht 5' 1.16" (1.553 m)  Wt 288 lb (130.636 kg)  BMI 54.17 kg/m2  SpO2 96%  Physical Exam  General Appearance:    Alert, cooperative, no distress, obese  Eyes:    PERRL, conjunctiva/corneas clear, EOM's intact       Lungs:     Clear to auscultation bilaterally,  respirations unlabored  Heart:    Regular rate and rhythm  Neurologic:   Awake, alert, oriented x 3. No apparent focal neurological           defect.        Results for orders placed or performed in visit on 02/26/15  POCT glycosylated hemoglobin (Hb A1C)  Result Value Ref Range   Hemoglobin A1C 5.9    Est. average glucose Bld gHb Est-mCnc 123        Assessment & Plan:     1. Type 2 diabetes mellitus without complication, without long-term current use of insulin (HCC) Improved since starting metformin.  - POCT glycosylated hemoglobin (Hb A1C)  Continue current dose of metformin and diet. Follow up 6 months to check a1c. Check BS weekly and call if sugar gets over 150.       Lelon Huh, MD  [facility]

## 2015-03-26 ENCOUNTER — Other Ambulatory Visit: Payer: Self-pay | Admitting: Family Medicine

## 2015-04-03 ENCOUNTER — Other Ambulatory Visit: Payer: Self-pay | Admitting: Family Medicine

## 2015-05-21 ENCOUNTER — Other Ambulatory Visit: Payer: Self-pay | Admitting: Family Medicine

## 2015-05-21 NOTE — Telephone Encounter (Signed)
Please call in alprazolam.  

## 2015-05-21 NOTE — Telephone Encounter (Signed)
Rx called in to pharmacy. 

## 2015-06-21 ENCOUNTER — Other Ambulatory Visit: Payer: Self-pay | Admitting: Family Medicine

## 2015-08-27 ENCOUNTER — Ambulatory Visit: Payer: Medicare Other | Admitting: Family Medicine

## 2015-09-23 ENCOUNTER — Other Ambulatory Visit: Payer: Self-pay | Admitting: Family Medicine

## 2015-09-23 DIAGNOSIS — R609 Edema, unspecified: Secondary | ICD-10-CM

## 2015-09-23 DIAGNOSIS — I509 Heart failure, unspecified: Secondary | ICD-10-CM

## 2015-12-12 ENCOUNTER — Other Ambulatory Visit: Payer: Self-pay | Admitting: Family Medicine

## 2015-12-12 DIAGNOSIS — I10 Essential (primary) hypertension: Secondary | ICD-10-CM

## 2015-12-24 ENCOUNTER — Other Ambulatory Visit: Payer: Self-pay | Admitting: Family Medicine

## 2016-01-15 ENCOUNTER — Other Ambulatory Visit: Payer: Self-pay | Admitting: Family Medicine

## 2016-02-24 ENCOUNTER — Other Ambulatory Visit: Payer: Self-pay | Admitting: Family Medicine

## 2016-02-24 DIAGNOSIS — I509 Heart failure, unspecified: Secondary | ICD-10-CM

## 2016-02-24 DIAGNOSIS — R609 Edema, unspecified: Secondary | ICD-10-CM

## 2016-02-24 NOTE — Telephone Encounter (Signed)
Patient has not been seen for a year. Need o.v. Scheduled before we can approve refills

## 2016-02-26 NOTE — Telephone Encounter (Signed)
Patient advised. Patient scheduled for a follow up appointment.  

## 2016-02-27 ENCOUNTER — Ambulatory Visit (INDEPENDENT_AMBULATORY_CARE_PROVIDER_SITE_OTHER): Payer: Medicare Other | Admitting: Family Medicine

## 2016-02-27 ENCOUNTER — Encounter: Payer: Self-pay | Admitting: Family Medicine

## 2016-02-27 VITALS — BP 132/84 | HR 71 | Temp 98.0°F | Resp 18 | Wt 289.0 lb

## 2016-02-27 DIAGNOSIS — Z23 Encounter for immunization: Secondary | ICD-10-CM | POA: Diagnosis not present

## 2016-02-27 DIAGNOSIS — I10 Essential (primary) hypertension: Secondary | ICD-10-CM | POA: Diagnosis not present

## 2016-02-27 DIAGNOSIS — E7800 Pure hypercholesterolemia, unspecified: Secondary | ICD-10-CM | POA: Diagnosis not present

## 2016-02-27 DIAGNOSIS — E119 Type 2 diabetes mellitus without complications: Secondary | ICD-10-CM | POA: Diagnosis not present

## 2016-02-27 LAB — POCT GLYCOSYLATED HEMOGLOBIN (HGB A1C)
ESTIMATED AVERAGE GLUCOSE: 126
Hemoglobin A1C: 6

## 2016-02-27 NOTE — Telephone Encounter (Signed)
Please review-aa 

## 2016-02-27 NOTE — Patient Instructions (Signed)
You can start taking Metformin every OTHER day

## 2016-02-27 NOTE — Progress Notes (Signed)
Patient: Crystal Pacheco Female    DOB: [birth year]   65 y.o.   MRN: [id number] Visit Date: 02/27/2016  Today's Provider: Lelon Huh, MD   Chief Complaint  Patient presents with  . Diabetes    follow up  . Hypertension    follow up  . Hyperlipidemia    follow up   Subjective:    HPI  Diabetes Mellitus Type II, Follow-up:   Lab Results  Component Value Date   HGBA1C 5.9 02/26/2015   HGBA1C 6.5 (H) 11/09/2014   HGBA1C 5.9 10/27/2013   Last seen for diabetes 1 years ago.  Management since then includes no changes. She reports poor compliance with treatment. She is having side effects. Metformin causes upset stomach and pain Current symptoms include none and have been stable. Home blood sugar records: fasting range: has not been checked recently  Episodes of hypoglycemia? no   Current Insulin Regimen: none Most Recent Eye Exam: >1 year Weight trend: stable Prior visit with dietician: no Current diet:  Well balanced Current exercise: none  ------------------------------------------------------------------------   Hypertension, follow-up:  BP Readings from Last 3 Encounters:  02/27/16 132/84  02/26/15 (!) 118/58  11/09/14 116/70    She was last seen for hypertension 1 months ago.  BP at that visit was 116/70. Management since that visit includes no changes.She reports good compliance with treatment. She is not having side effects.  She is not exercising. She is adherent to low salt diet.   Outside blood pressures are not being checked. She is experiencing lower extremity edema.  Patient denies chest pain, chest pressure/discomfort, claudication, dyspnea, exertional chest pressure/discomfort, fatigue, irregular heart beat, near-syncope, orthopnea, palpitations, paroxysmal nocturnal dyspnea, syncope and tachypnea.   Cardiovascular risk factors include advanced age (older than 33 for men, 19 for women), diabetes mellitus, dyslipidemia, hypertension and  sedentary lifestyle.  Use of agents associated with hypertension: NSAIDS.   ------------------------------------------------------------------------    Lipid/Cholesterol, Follow-up:   Last seen for this 1 years ago.  Management since that visit includes no changes.  Last Lipid Panel:    Component Value Date/Time   CHOL 187 11/09/2014 1126   TRIG 157 (H) 11/09/2014 1126   HDL 38 (L) 11/09/2014 1126   CHOLHDL 4.9 (H) 11/09/2014 1126   LDLCALC 118 (H) 11/09/2014 1126    She reports good compliance with treatment. She is not having side effects.   Wt Readings from Last 3 Encounters:  02/27/16 289 lb (131.1 kg)  02/26/15 288 lb (130.6 kg)  11/09/14 298 lb (135.2 kg)    ------------------------------------------------------------------------     No Known Allergies   Current Outpatient Prescriptions:  .  ALPRAZolam (XANAX) 0.25 MG tablet, TAKE 1/2 TO 1 TABLET TWICE A DAY IF NEEDED, Disp: 180 tablet, Rfl: 1 .  aspirin 81 MG tablet, Take 81 mg by mouth daily., Disp: , Rfl:  .  atorvastatin (LIPITOR) 80 MG tablet, TAKE 1 TABLET EVERY DAY, Disp: 90 tablet, Rfl: 0 .  furosemide (LASIX) 20 MG tablet, Take 1-2 tablets (20-40 mg total) by mouth daily. PATIENT NEEDS TO SCHEDULE OFFICE VISIT FOR FOLLOW UP, Disp: 60 tablet, Rfl: 0 .  glucose blood test strip, Use as instructed, Disp: 100 each, Rfl: 12 .  lisinopril (PRINIVIL,ZESTRIL) 10 MG tablet, TAKE ONE TABLET BY MOUTH DAILY, Disp: 30 tablet, Rfl: 0 .  metFORMIN (GLUCOPHAGE-XR) 500 MG 24 hr tablet, TAKE 1 TABLET EVERY DAY WITH BREAKFAST, Disp: 90 tablet, Rfl: 1 .  metoprolol tartrate (LOPRESSOR)  25 MG tablet, TAKE ONE TABLET TWICE A DAY, Disp: 180 tablet, Rfl: 0 .  NITROSTAT 0.4 MG SL tablet, DISSOLVE 1 TABLET UNDER TONGUE EVERY 3 TO 5 MINUTES AS NEEDED FOR CHEST PAIN, Disp: 25 tablet, Rfl: 0 .  omeprazole (PRILOSEC) 20 MG capsule, TAKE 1 CAPSULE BY MOUTH TWICE DAILY, Disp: 180 capsule, Rfl: 6  Review of Systems  Constitutional:  Negative for appetite change, chills, fatigue and fever.  Respiratory: Negative for chest tightness and shortness of breath.   Cardiovascular: Negative for chest pain and palpitations.  Gastrointestinal: Negative for abdominal pain, nausea and vomiting.  Neurological: Negative for dizziness and weakness.    Social History  Substance Use Topics  . Smoking status: Former Smoker    Packs/day: 1.00    Years: 20.00    Types: Cigarettes    Quit date: 11/15/2007  . Smokeless tobacco: Never Used  . Alcohol use 1.0 oz/week    2 Standard drinks or equivalent per week   Objective:   BP 132/84 (BP Location: Left Arm, Patient Position: Sitting, Cuff Size: Large)   Pulse 71   Temp 98 F (36.7 C) (Oral)   Resp 18   Wt 289 lb (131.1 kg)   SpO2 95% Comment: room air  BMI 54.32 kg/m   Physical Exam  General Appearance:    Alert, cooperative, no distress, obese  Eyes:    PERRL, conjunctiva/corneas clear, EOM's intact       Lungs:     Clear to auscultation bilaterally, respirations unlabored  Heart:    Regular rate and rhythm  Neurologic:   Awake, alert, oriented x 3. No apparent focal neurological           defect.        Results for orders placed or performed in visit on 02/27/16  POCT HgB A1C  Result Value Ref Range   Hemoglobin A1C 6.0    Est. average glucose Bld gHb Est-mCnc 126        Assessment & Plan:     1. Type 2 diabetes mellitus without complication, without long-term current use of insulin (HCC) Better since starting metformin, but she states it still upsets her stomach after taking it for a few day. Can try taking QOD for the time  Being. - POCT HgB A1C - Renal function panel - TSH  2. Need for pneumococcal vaccination  - Pneumococcal polysaccharide vaccine 23-valent greater than or equal to 2yo subcutaneous/IM  3. Pure hypercholesterolemia She is tolerating atorvastatin well with no adverse effects.   - Lipid panel  4. Essential hypertension Well controlled.   Continue current medications.    The entirety of the information documented in the History of Present Illness, Review of Systems and Physical Exam were personally obtained by me. Portions of this information were initially documented by Meyer Cory, CMA and reviewed by me for thoroughness and accuracy.        Lelon Huh, MD  [facility]

## 2016-02-28 ENCOUNTER — Telehealth: Payer: Self-pay | Admitting: Emergency Medicine

## 2016-02-28 ENCOUNTER — Other Ambulatory Visit: Payer: Self-pay | Admitting: Family Medicine

## 2016-02-28 DIAGNOSIS — R609 Edema, unspecified: Secondary | ICD-10-CM

## 2016-02-28 DIAGNOSIS — I10 Essential (primary) hypertension: Secondary | ICD-10-CM

## 2016-02-28 DIAGNOSIS — I509 Heart failure, unspecified: Secondary | ICD-10-CM

## 2016-02-28 LAB — RENAL FUNCTION PANEL
Albumin: 4.4 g/dL (ref 3.6–4.8)
BUN/Creatinine Ratio: 20 (ref 12–28)
BUN: 21 mg/dL (ref 8–27)
CALCIUM: 9.1 mg/dL (ref 8.7–10.3)
CHLORIDE: 102 mmol/L (ref 96–106)
CO2: 21 mmol/L (ref 18–29)
Creatinine, Ser: 1.07 mg/dL — ABNORMAL HIGH (ref 0.57–1.00)
GFR calc Af Amer: 63 mL/min/{1.73_m2} (ref >59)
GFR, EST NON AFRICAN AMERICAN: 54 mL/min/{1.73_m2} — AB (ref >59)
GLUCOSE: 118 mg/dL — AB (ref 65–99)
PHOSPHORUS: 3 mg/dL (ref 2.5–4.5)
POTASSIUM: 4.6 mmol/L (ref 3.5–5.2)
SODIUM: 142 mmol/L (ref 134–144)

## 2016-02-28 LAB — LIPID PANEL
CHOLESTEROL TOTAL: 194 mg/dL (ref 100–199)
Chol/HDL Ratio: 5.4 ratio units — ABNORMAL HIGH (ref 0.0–4.4)
HDL: 36 mg/dL — ABNORMAL LOW (ref >39)
LDL CALC: 120 mg/dL — AB (ref 0–99)
Triglycerides: 189 mg/dL — ABNORMAL HIGH (ref 0–149)
VLDL Cholesterol Cal: 38 mg/dL (ref 5–40)

## 2016-02-28 LAB — TSH: TSH: 1.49 u[IU]/mL (ref 0.450–4.500)

## 2016-02-28 MED ORDER — FUROSEMIDE 20 MG PO TABS: 20.0000 mg | ORAL_TABLET | Freq: Every day | ORAL | 3 refills | Status: DC

## 2016-02-28 MED ORDER — LISINOPRIL 10 MG PO TABS: 10.0000 mg | ORAL_TABLET | Freq: Every day | ORAL | 2 refills | Status: DC

## 2016-02-28 NOTE — Telephone Encounter (Signed)
Informed pt of lab results. She will need refills on the her other medications as well as the others sent in yesterday. She needs Metoprolol, Metformin and Xanax. Please advise. Thanks.

## 2016-02-28 NOTE — Telephone Encounter (Signed)
Pt called to see if her other medications had been sent in for 90 days supplies and to ask that her furosemide (LASIX) 20 MG tablet & lisinopril (PRINIVIL,ZESTRIL) 10 MG tablet that were sent in yesterday, be changed to 90 day supply and recent to pharmacy. Please advise. Thanks TNP

## 2016-02-29 NOTE — Telephone Encounter (Signed)
Please call in alprazolam.  

## 2016-03-02 NOTE — Telephone Encounter (Signed)
Rx called in to pharmacy. 

## 2016-08-25 ENCOUNTER — Ambulatory Visit (INDEPENDENT_AMBULATORY_CARE_PROVIDER_SITE_OTHER): Payer: Medicare Other | Admitting: Family Medicine

## 2016-08-25 ENCOUNTER — Encounter: Payer: Self-pay | Admitting: Family Medicine

## 2016-08-25 VITALS — BP 128/80 | HR 80 | Temp 98.9°F | Resp 16 | Wt 287.0 lb

## 2016-08-25 DIAGNOSIS — L659 Nonscarring hair loss, unspecified: Secondary | ICD-10-CM

## 2016-08-25 DIAGNOSIS — E119 Type 2 diabetes mellitus without complications: Secondary | ICD-10-CM | POA: Diagnosis not present

## 2016-08-25 DIAGNOSIS — L309 Dermatitis, unspecified: Secondary | ICD-10-CM | POA: Diagnosis not present

## 2016-08-25 MED ORDER — PREDNISONE 10 MG PO TABS: ORAL_TABLET | ORAL | 0 refills | Status: AC

## 2016-08-25 NOTE — Progress Notes (Signed)
Patient: Crystal Pacheco Female    DOB: 1950/05/12   66 y.o.   MRN: [id number] Visit Date: 08/25/2016  Today's Provider: Lelon Huh, MD   Chief Complaint  Patient presents with  . dry scalp   Subjective:    HPI Patient comes in today c/o a dry, itchy scalp and hair loss. Patient reports that it started about 2-3 weeks ago. She reports that after washing her hair, she has a large amount of hair left in her brush. She also has been taking Benadryl to help with the itching she has in her scalp which has helped. Patient denies any changes in hair products (shampoo, conditioner, etc) but she did mention her hair dresser putting color in her hair and her scalp having a "burning" sensation.      She is also due for follow up diabetes. Feels well. Not checking sugars regularly Lab Results  Component Value Date   HGBA1C 6.0 02/27/2016     No Known Allergies   Current Outpatient Prescriptions:  .  ALPRAZolam (XANAX) 0.25 MG tablet, TAKE 1/2 TO 1 TABLET TWICE A DAY IF NEEDED, Disp: 180 tablet, Rfl: 1 .  aspirin 81 MG tablet, Take 81 mg by mouth daily., Disp: , Rfl:  .  atorvastatin (LIPITOR) 80 MG tablet, TAKE 1 TABLET EVERY DAY USUALLY IN THE EVENING, Disp: 90 tablet, Rfl: 3 .  furosemide (LASIX) 20 MG tablet, Take 1-2 tablets (20-40 mg total) by mouth daily., Disp: 90 tablet, Rfl: 3 .  glucose blood test strip, Use as instructed, Disp: 100 each, Rfl: 12 .  lisinopril (PRINIVIL,ZESTRIL) 10 MG tablet, Take 1 tablet (10 mg total) by mouth daily., Disp: 90 tablet, Rfl: 2 .  metFORMIN (GLUCOPHAGE-XR) 500 MG 24 hr tablet, TAKE 1 TABLET BY MOUTH EVERY DAY WITH BREAKFAST, Disp: 90 tablet, Rfl: 3 .  metoprolol tartrate (LOPRESSOR) 25 MG tablet, TAKE 1 TABLET BY MOUTH TWICE DAILY, Disp: 180 tablet, Rfl: 3 .  NITROSTAT 0.4 MG SL tablet, DISSOLVE 1 TABLET UNDER TONGUE EVERY 3 TO 5 MINUTES AS NEEDED FOR CHEST PAIN, Disp: 25 tablet, Rfl: 0 .  omeprazole (PRILOSEC) 20 MG capsule, TAKE 1 CAPSULE  BY MOUTH TWICE DAILY, Disp: 180 capsule, Rfl: 6  Review of Systems  Constitutional: Negative.   Skin: Positive for color change.       Redness throughout scalp.   Neurological: Negative.     Social History  Substance Use Topics  . Smoking status: Former Smoker    Packs/day: 1.00    Years: 20.00    Types: Cigarettes    Quit date: 11/15/2007  . Smokeless tobacco: Never Used  . Alcohol use 1.0 oz/week    2 Standard drinks or equivalent per week   Objective:   BP 128/80 (BP Location: Right Arm, Patient Position: Sitting, Cuff Size: Large)   Pulse 80   Temp 98.9 F (37.2 C)   Resp 16   Wt 287 lb (130.2 kg)   SpO2 96%   BMI 53.94 kg/m  Vitals:   08/25/16 1604  BP: 128/80  Pulse: 80  Resp: 16  Temp: 98.9 F (37.2 C)  SpO2: 96%  Weight: 287 lb (130.2 kg)     Physical Exam  General Appearance:    Alert, cooperative, no distress, obese  Eyes:    PERRL, conjunctiva/corneas clear, EOM's intact       Lungs:     Clear to auscultation bilaterally, respirations unlabored  Heart:    Regular  rate and rhythm  Derm :   Patches of bare scalp and dry flaky skin. No erythema. Also patches of nummular eczema  On right leg.           Assessment & Plan:     1. Alopecia Likely allergic or irritant reaction to dye.  - Comprehensive metabolic panel - CBC - T4 AND TSH - predniSONE (DELTASONE) 10 MG tablet; 6 tablets for 2 days, then 5 for 2 days, then 4 for 2 days, then 3 for 2 days, then 2 for 2 days, then 1 for 2 days.  Dispense: 42 tablet; Refill: 0  2. Type 2 diabetes mellitus without complication, without long-term current use of insulin (HCC)  - Hemoglobin A1c  3. Eczema, unspecified type  - predniSONE (DELTASONE) 10 MG tablet; 6 tablets for 2 days, then 5 for 2 days, then 4 for 2 days, then 3 for 2 days, then 2 for 2 days, then 1 for 2 days.  Dispense: 42 tablet; Refill: 0       Lelon Huh, MD  [facility]

## 2016-08-26 ENCOUNTER — Telehealth: Payer: Self-pay

## 2016-08-26 LAB — CBC
HEMATOCRIT: 41.1 % (ref 34.0–46.6)
HEMOGLOBIN: 13.5 g/dL (ref 11.1–15.9)
MCH: 30.8 pg (ref 26.6–33.0)
MCHC: 32.8 g/dL (ref 31.5–35.7)
MCV: 94 fL (ref 79–97)
Platelets: 258 10*3/uL (ref 150–379)
RBC: 4.39 x10E6/uL (ref 3.77–5.28)
RDW: 14.5 % (ref 12.3–15.4)
WBC: 10.6 10*3/uL (ref 3.4–10.8)

## 2016-08-26 LAB — COMPREHENSIVE METABOLIC PANEL
ALT: 15 IU/L (ref 0–32)
AST: 18 IU/L (ref 0–40)
Albumin/Globulin Ratio: 1.4 (ref 1.2–2.2)
Albumin: 4.6 g/dL (ref 3.6–4.8)
Alkaline Phosphatase: 66 IU/L (ref 39–117)
BILIRUBIN TOTAL: 0.5 mg/dL (ref 0.0–1.2)
BUN/Creatinine Ratio: 15 (ref 12–28)
BUN: 20 mg/dL (ref 8–27)
CHLORIDE: 100 mmol/L (ref 96–106)
CO2: 22 mmol/L (ref 18–29)
Calcium: 9.9 mg/dL (ref 8.7–10.3)
Creatinine, Ser: 1.37 mg/dL — ABNORMAL HIGH (ref 0.57–1.00)
GFR calc Af Amer: 46 mL/min/{1.73_m2} — ABNORMAL LOW (ref >59)
GFR calc non Af Amer: 40 mL/min/{1.73_m2} — ABNORMAL LOW (ref >59)
GLUCOSE: 95 mg/dL (ref 65–99)
Globulin, Total: 3.3 g/dL (ref 1.5–4.5)
Potassium: 4.6 mmol/L (ref 3.5–5.2)
Sodium: 142 mmol/L (ref 134–144)
TOTAL PROTEIN: 7.9 g/dL (ref 6.0–8.5)

## 2016-08-26 LAB — T4 AND TSH
T4, Total: 6.4 ug/dL (ref 4.5–12.0)
TSH: 1.97 u[IU]/mL (ref 0.450–4.500)

## 2016-08-26 LAB — HEMOGLOBIN A1C
ESTIMATED AVERAGE GLUCOSE: 128 mg/dL
Hgb A1c MFr Bld: 6.1 % — ABNORMAL HIGH (ref 4.8–5.6)

## 2016-08-26 NOTE — Telephone Encounter (Signed)
lmtcb

## 2016-08-26 NOTE — Telephone Encounter (Signed)
-----   Message from Birdie Sons, MD sent at 08/26/2016  8:05 AM EDT ----- Labs all normal. Hair loss is likely allergic reaction to dye and should improve on prednisone that was prescribed. Diabetes is well controlled with A1c=6.1. Continue current medications.  Call for dermatology referral if hair loss has not stopped by next week. Schedule diabetes follow up in 6 months.

## 2016-09-09 ENCOUNTER — Other Ambulatory Visit: Payer: Self-pay | Admitting: Family Medicine

## 2016-09-09 NOTE — Telephone Encounter (Signed)
Rx called in to pharmacy. 

## 2017-01-18 ENCOUNTER — Other Ambulatory Visit: Payer: Self-pay | Admitting: Family Medicine

## 2017-02-08 ENCOUNTER — Telehealth: Payer: Self-pay | Admitting: Family Medicine

## 2017-02-08 DIAGNOSIS — R609 Edema, unspecified: Secondary | ICD-10-CM

## 2017-02-08 NOTE — Telephone Encounter (Signed)
Please advise 

## 2017-02-08 NOTE — Telephone Encounter (Signed)
[facility] faxed refill request on the following medications:  furosemide (LASIX) 20 MG tablet Per the pharmacy pt is taking 2 pills a day and is  requesting 180 pills  [facility]

## 2017-02-09 MED ORDER — FUROSEMIDE 20 MG PO TABS: 20.0000 mg | ORAL_TABLET | Freq: Every day | ORAL | 0 refills | Status: DC

## 2017-02-09 NOTE — Telephone Encounter (Signed)
Please advise patient she is due for diabetes follow up within the next month. Have sent refill to get by until her appointment.

## 2017-02-09 NOTE — Telephone Encounter (Signed)
Left message to call back  

## 2017-03-13 ENCOUNTER — Other Ambulatory Visit: Payer: Self-pay | Admitting: Family Medicine

## 2017-03-13 DIAGNOSIS — I10 Essential (primary) hypertension: Secondary | ICD-10-CM

## 2017-05-12 ENCOUNTER — Telehealth: Payer: Self-pay | Admitting: Family Medicine

## 2017-05-12 NOTE — Telephone Encounter (Signed)
Pt stated that she received a jury summons for her to report for jury duty on 06/06/17. Pt is requesting a letter from Dr. Caryn Section to excuse her from jury duty due to her health conditions. Pt stated that she doesn't think that she could walk that much and with her taking a fluid pill she would be going to the restroom often because if she doesn't take that medication her legs would swell. Pt stated that she would have to have the letter turned in no later than 05/31/17. Please advise. Thanks TNP

## 2017-05-12 NOTE — Telephone Encounter (Signed)
Please advise 

## 2017-05-12 NOTE — Telephone Encounter (Signed)
Is overdue for follow up office visit and needs to schedule. We can discuss letter when she comes in

## 2017-05-13 NOTE — Telephone Encounter (Signed)
Patient was advised and scheduled appt.

## 2017-05-19 ENCOUNTER — Encounter: Payer: Self-pay | Admitting: Family Medicine

## 2017-05-19 ENCOUNTER — Telehealth: Payer: Self-pay | Admitting: Family Medicine

## 2017-05-19 ENCOUNTER — Ambulatory Visit (INDEPENDENT_AMBULATORY_CARE_PROVIDER_SITE_OTHER): Payer: Medicare Other | Admitting: Family Medicine

## 2017-05-19 VITALS — BP 120/60 | HR 89 | Temp 98.4°F | Resp 16 | Ht 61.0 in | Wt 293.0 lb

## 2017-05-19 DIAGNOSIS — D126 Benign neoplasm of colon, unspecified: Secondary | ICD-10-CM

## 2017-05-19 DIAGNOSIS — E785 Hyperlipidemia, unspecified: Secondary | ICD-10-CM

## 2017-05-19 DIAGNOSIS — R609 Edema, unspecified: Secondary | ICD-10-CM | POA: Diagnosis not present

## 2017-05-19 DIAGNOSIS — I10 Essential (primary) hypertension: Secondary | ICD-10-CM

## 2017-05-19 DIAGNOSIS — M109 Gout, unspecified: Secondary | ICD-10-CM | POA: Diagnosis not present

## 2017-05-19 DIAGNOSIS — E119 Type 2 diabetes mellitus without complications: Secondary | ICD-10-CM | POA: Diagnosis not present

## 2017-05-19 LAB — POCT GLYCOSYLATED HEMOGLOBIN (HGB A1C)
ESTIMATED AVERAGE GLUCOSE: 134
Hemoglobin A1C: 6.3

## 2017-05-19 MED ORDER — COLCHICINE 0.6 MG PO TABS: ORAL_TABLET | ORAL | 1 refills | Status: AC

## 2017-05-19 MED ORDER — METOPROLOL TARTRATE 25 MG PO TABS: 25.0000 mg | ORAL_TABLET | Freq: Two times a day (BID) | ORAL | 3 refills | Status: DC

## 2017-05-19 MED ORDER — FUROSEMIDE 20 MG PO TABS: 20.0000 mg | ORAL_TABLET | Freq: Every day | ORAL | 1 refills | Status: DC

## 2017-05-19 MED ORDER — LISINOPRIL 10 MG PO TABS: 10.0000 mg | ORAL_TABLET | Freq: Every day | ORAL | 3 refills | Status: DC

## 2017-05-19 MED ORDER — METFORMIN HCL ER 500 MG PO TB24: ORAL_TABLET | ORAL | 3 refills | Status: AC

## 2017-05-19 NOTE — Progress Notes (Signed)
Patient: Crystal Pacheco Female    DOB: [birth year]   67 y.o.   MRN: [id number] Visit Date: 05/19/2017  Today's Provider: Lelon Huh, MD   Chief Complaint  Patient presents with  . Follow-up  . Diabetes  . Hypertension  . Hyperlipidemia   Subjective:    HPI   Diabetes Mellitus Type II, Follow-up:   Lab Results  Component Value Date   HGBA1C 6.3 05/19/2017   HGBA1C 6.1 (H) 08/25/2016   HGBA1C 6.0 02/27/2016   Last seen for diabetes 8 months ago.  Management since then includes; labs checked, no changes. She reports good compliance with treatment. She is not having side effects. none Current symptoms include none and have been unchanged. Home blood sugar records: fasting range: 115  Episodes of hypoglycemia? no   Current Insulin Regimen: n/a Most Recent Eye Exam: due Weight trend: stable Prior visit with dietician: no Current diet: in general, an "unhealthy" diet Current exercise: none  ----------------------------------------------------------------    Hypertension, follow-up:  BP Readings from Last 3 Encounters:  05/19/17 120/60  08/25/16 128/80  02/27/16 132/84    She was last seen for hypertension  02/27/2016.  BP at that visit was 132/84. Management since that visit includes; no changes.She reports good compliance with treatment. She is not having side effects. none She is not exercising. She is not adherent to low salt diet.   Outside blood pressures are                                                                                                                                                                                                                                                                                  . She is experiencing none.  Patient denies none.   Cardiovascular risk factors include diabetes mellitus.  Use of agents associated with hypertension: none.    ----------------------------------------------------------------    Lipid/Cholesterol, Follow-up:   Last seen for this 02/27/2016.  Management since that visit includes; no changes.  Last Lipid Panel:    Component Value Date/Time   CHOL 194 02/27/2016 1207   TRIG 189 (H) 02/27/2016 1207   HDL 36 (L) 02/27/2016 1207  CHOLHDL 5.4 (H) 02/27/2016 1207   LDLCALC 120 (H) 02/27/2016 1207    She reports good compliance with treatment. She is not having side effects. none  Wt Readings from Last 3 Encounters:  05/19/17 293 lb (132.9 kg)  08/25/16 287 lb (130.2 kg)  02/27/16 289 lb (131.1 kg)    ------------------------------------------------------------------------  Patient has had swelling in her left foot for over 1 week. Foot is very painful and hard to walk on. No known injury. Has not taken any medications for it.   Follow up CHF She states she does take 2 x 20mg  furosemide very day due to leg swelling. She tolerates this well and states she has no chest pain and rarely has trouble breathing unless she walks for long distances.    No Known Allergies   Current Outpatient Medications:  .  ALPRAZolam (XANAX) 0.25 MG tablet, TAKE 1/2-1 TABLET BY MOUTH TWICE DAILY AS NEEDED, Disp: 180 tablet, Rfl: 1 .  aspirin 81 MG tablet, Take 81 mg by mouth daily., Disp: , Rfl:  .  atorvastatin (LIPITOR) 80 MG tablet, TAKE 1 TABLET EVERY DAY USUALLY IN THE EVENING, Disp: 90 tablet, Rfl: 3 .  furosemide (LASIX) 20 MG tablet, Take 1-2 tablets (20-40 mg total) daily by mouth., Disp: 180 tablet, Rfl: 0 .  glucose blood test strip, Use as instructed, Disp: 100 each, Rfl: 12 .  lisinopril (PRINIVIL,ZESTRIL) 10 MG tablet, Take 1 tablet (10 mg total) by mouth daily., Disp: 90 tablet, Rfl: 2 .  metFORMIN (GLUCOPHAGE-XR) 500 MG 24 hr tablet, TAKE 1 TABLET BY MOUTH EVERY DAY WITH BREAKFAST, Disp: 90 tablet, Rfl: 3 .  metoprolol tartrate (LOPRESSOR) 25 MG tablet, TAKE 1 TABLET BY MOUTH TWO TIMES  DAILY, Disp: 180 tablet, Rfl: 0 .  NITROSTAT 0.4 MG SL tablet, DISSOLVE 1 TABLET UNDER TONGUE EVERY 3 TO 5 MINUTES AS NEEDED FOR CHEST PAIN, Disp: 25 tablet, Rfl: 0 .  omeprazole (PRILOSEC) 20 MG capsule, TAKE 1 CAPSULE BY MOUTH TWO TIMES DAILY, Disp: 180 capsule, Rfl: 1  Review of Systems  Constitutional: Negative for appetite change, chills, fatigue and fever.  Respiratory: Negative for chest tightness and shortness of breath.   Cardiovascular: Negative for chest pain and palpitations.  Gastrointestinal: Negative for abdominal pain, nausea and vomiting.  Neurological: Negative for dizziness and weakness.    Social History   Tobacco Use  . Smoking status: Former Smoker    Packs/day: 1.00    Years: 20.00    Pack years: 20.00    Types: Cigarettes    Last attempt to quit: 11/15/2007    Years since quitting: 9.5  . Smokeless tobacco: Never Used  Substance Use Topics  . Alcohol use: Yes    Alcohol/week: 1.0 oz    Types: 2 Standard drinks or equivalent per week   Objective:   BP 120/60 (BP Location: Right Arm, Patient Position: Sitting, Cuff Size: Large)   Pulse 89   Temp 98.4 F (36.9 C) (Oral)   Resp 16   Ht 5\' 1"  (1.549 m)   Wt 293 lb (132.9 kg)   SpO2 95%   BMI 55.36 kg/m  Vitals:   05/19/17 1156  BP: 120/60  Pulse: 89  Resp: 16  Temp: 98.4 F (36.9 C)  TempSrc: Oral  SpO2: 95%  Weight: 293 lb (132.9 kg)  Height: 5\' 1"  (1.549 m)     Physical Exam  General Appearance:    Alert, cooperative, no distress, morbidly obese  Eyes:    PERRL,  conjunctiva/corneas clear, EOM's intact       Lungs:     Clear to auscultation bilaterally, respirations unlabored  Heart:    Regular rate and rhythm  Neurologic:   Awake, alert, oriented x 3. No apparent focal neurological           defect.   MS:   Moderately red, tender, swollen left lateral great toe, consistent with acute gout.    EKG: NSR, no ischemic changes.  Results for orders placed or performed in visit on 05/19/17   POCT glycosylated hemoglobin (Hb A1C)  Result Value Ref Range   Hemoglobin A1C 6.3    Est. average glucose Bld gHb Est-mCnc 134        Assessment & Plan:     1. Type 2 diabetes mellitus without complication, without long-term current use of insulin (HCC) Well controlled. Continue current dose of metformin.  - POCT glycosylated hemoglobin (Hb A1C)  2. Essential hypertension Well controlled.  Continue current medications.   - metoprolol tartrate (LOPRESSOR) 25 MG tablet; Take 1 tablet (25 mg total) by mouth 2 (two) times daily.  Dispense: 180 tablet; Refill: 3 - lisinopril (PRINIVIL,ZESTRIL) 10 MG tablet; Take 1 tablet (10 mg total) by mouth daily.  Dispense: 90 tablet; Refill: 3 - metFORMIN (GLUCOPHAGE-XR) 500 MG 24 hr tablet; TAKE 1 TABLET BY MOUTH EVERY DAY WITH BREAKFAST  Dispense: 90 tablet; Refill: 3 - EKG 12-Lead  3. Edema, unspecified type Continue 1-2 furosemide daily. - furosemide (LASIX) 20 MG tablet; Take 1-2 tablets (20-40 mg total) by mouth daily.  Dispense: 180 tablet; Refill: 1  4. Acute gout involving toe of left foot, unspecified cause New problem. She is having trouble walking and was recently summoned for jury duty. For this and chronic health.  problems letter is written today that she is medically unable to serve as juror. Also completed medical application for handicap parking permit.  - Uric acid - colchicine 0.6 MG tablet; Take 2 tablets today, then one daily until gout is gone  Dispense: 30 tablet; Refill: 1  5. Tubular adenoma of colon Review of records finds she had a tubular adenoma excised by Dr. Sonny Masters in 2007 and she has not returned to GI for recommended follow up. Counseled on importance of follow up colonoscopy to reduce risk of developing colon cancer.  - Ambulatory referral to Gastroenterology  6. Hyperlipidemia, unspecified hyperlipidemia type She is tolerating atorvastatin well with no adverse effects.   - Lipid panel - Comprehensive  metabolic panel  Addressed extensive list of chronic and acute medical problems today requiring extensive time in counseling and coordination of care.  Over half of this 45 minute visit were spent in counseling and coordinating care of multiple medical problems.          Lelon Huh, MD  [facility]

## 2017-05-19 NOTE — Telephone Encounter (Signed)
Patient called and stated that her gout medicine cost over $200 and she did not get it, she is requesting someone to call her back or call the insurance company and let them know why she needs this medicine   CB# [phone]

## 2017-05-19 NOTE — Telephone Encounter (Signed)
Please advise 

## 2017-05-20 ENCOUNTER — Telehealth: Payer: Self-pay

## 2017-05-20 ENCOUNTER — Other Ambulatory Visit: Payer: Self-pay | Admitting: Family Medicine

## 2017-05-20 DIAGNOSIS — E790 Hyperuricemia without signs of inflammatory arthritis and tophaceous disease: Secondary | ICD-10-CM | POA: Insufficient documentation

## 2017-05-20 DIAGNOSIS — M109 Gout, unspecified: Secondary | ICD-10-CM

## 2017-05-20 LAB — COMPREHENSIVE METABOLIC PANEL
A/G RATIO: 1.3 (ref 1.2–2.2)
ALBUMIN: 4.2 g/dL (ref 3.6–4.8)
ALT: 11 IU/L (ref 0–32)
AST: 13 IU/L (ref 0–40)
Alkaline Phosphatase: 68 IU/L (ref 39–117)
BILIRUBIN TOTAL: 0.5 mg/dL (ref 0.0–1.2)
BUN / CREAT RATIO: 18 (ref 12–28)
BUN: 26 mg/dL (ref 8–27)
CHLORIDE: 101 mmol/L (ref 96–106)
CO2: 22 mmol/L (ref 20–29)
Calcium: 9.1 mg/dL (ref 8.7–10.3)
Creatinine, Ser: 1.47 mg/dL — ABNORMAL HIGH (ref 0.57–1.00)
GFR calc non Af Amer: 37 mL/min/{1.73_m2} — ABNORMAL LOW (ref >59)
GFR, EST AFRICAN AMERICAN: 42 mL/min/{1.73_m2} — AB (ref >59)
GLOBULIN, TOTAL: 3.3 g/dL (ref 1.5–4.5)
Glucose: 128 mg/dL — ABNORMAL HIGH (ref 65–99)
POTASSIUM: 4.8 mmol/L (ref 3.5–5.2)
SODIUM: 138 mmol/L (ref 134–144)
Total Protein: 7.5 g/dL (ref 6.0–8.5)

## 2017-05-20 LAB — LIPID PANEL
CHOL/HDL RATIO: 5.9 ratio — AB (ref 0.0–4.4)
CHOLESTEROL TOTAL: 207 mg/dL — AB (ref 100–199)
HDL: 35 mg/dL — AB (ref >39)
LDL Calculated: 135 mg/dL — ABNORMAL HIGH (ref 0–99)
TRIGLYCERIDES: 185 mg/dL — AB (ref 0–149)
VLDL Cholesterol Cal: 37 mg/dL (ref 5–40)

## 2017-05-20 LAB — URIC ACID: Uric Acid: 9.2 mg/dL — ABNORMAL HIGH (ref 2.5–7.1)

## 2017-05-20 MED ORDER — ATORVASTATIN CALCIUM 80 MG PO TABS: ORAL_TABLET | ORAL | 4 refills | Status: AC

## 2017-05-20 MED ORDER — ALLOPURINOL 100 MG PO TABS
100.0000 mg | ORAL_TABLET | Freq: Every day | ORAL | 4 refills | Status: AC
Start: 2017-05-20 — End: ?

## 2017-05-20 NOTE — Telephone Encounter (Signed)
-----   Message from Birdie Sons, MD sent at 05/20/2017  7:50 AM EST ----- Has very high uric acid which is cause of gout attacks. Need to start allopurinol 100mg  one tablet daily #90 RF X 4 to prevent future gout attacks. Need to take the colchicine that was prescribed yesterday every day for one month to get current gout attack under control.  Her cholesterol is high at 207, be sure to take atorvastatin every day, have sent refill to her pharmacy Schedule follow up for diabetes in 6 months.

## 2017-05-20 NOTE — Telephone Encounter (Signed)
Patient advised and verbally voiced understanding. Patient told me that she didn't pick up the Colchicine that was prescribed yesterday because it was $270.46. I called the pharmacy and the pharmacist states that that price was for the generic. Patient can get the brand name colcrys for $37.00. I advised patient of this and she says that is more affordable and she would pick the prescription up. I sent in the prescription for Allopurinol. Patient wants to call back to schedule DM follow up.

## 2017-05-21 ENCOUNTER — Other Ambulatory Visit: Payer: Self-pay | Admitting: Family Medicine

## 2017-05-21 MED ORDER — ALPRAZOLAM 0.25 MG PO TABS: ORAL_TABLET | ORAL | 1 refills | Status: AC

## 2017-05-21 MED ORDER — OMEPRAZOLE 20 MG PO CPDR: DELAYED_RELEASE_CAPSULE | ORAL | 1 refills | Status: AC

## 2017-05-21 NOTE — Telephone Encounter (Signed)
Patient requesting refill on her ALPRAZolam (XANAX) 0.25 MG tablet   omeprazole (PRILOSEC) 20 MG capsule  90 days  [facility]    CB# [phone]

## 2017-08-30 ENCOUNTER — Other Ambulatory Visit: Payer: Self-pay | Admitting: Family Medicine

## 2017-08-30 DIAGNOSIS — R609 Edema, unspecified: Secondary | ICD-10-CM

## 2017-10-08 ENCOUNTER — Telehealth: Payer: Self-pay | Admitting: Family Medicine

## 2017-10-08 NOTE — Telephone Encounter (Signed)
You placed an order in March 2019 for a colonoscopy.Pt stated she would call back with name of doctor she would like to see.I have left messages to f/u without a return phone call

## 2018-01-27 ENCOUNTER — Telehealth: Payer: Self-pay

## 2018-01-27 ENCOUNTER — Ambulatory Visit: Payer: Medicare Other | Admitting: Family Medicine

## 2018-01-27 NOTE — Telephone Encounter (Signed)
Patient called complaining of swelling in both legs for the past week. She also states she has been short of breath for the past 2 weeks. She denies any chest pain, blurred vision, headache, or nausea. She is currently taking Furosemide 20mg , two pills daily. Patient was scheduled to come in for an appointment today at 4:20pm with Carmon Ginsberg, PA-C. Per Mikki Santee, patient can double up on Furosemide to 40mg  twice a day for the next 3 days, then follow up with Dr. Caryn Section on Monday 01/31/2018. Patient was advised of this and is in agreement with this plan. Today's appointment was cancelled and rescheduled for Monday with Dr. Caryn Section at 3:00pm. Patient was advised to go to the ER if symptoms worsened or new symptoms developed. Patient verbally voiced understanding.

## 2018-01-31 ENCOUNTER — Encounter: Payer: Self-pay | Admitting: Family Medicine

## 2018-01-31 ENCOUNTER — Ambulatory Visit: Payer: Medicare Other | Admitting: Family Medicine

## 2018-01-31 ENCOUNTER — Telehealth: Payer: Self-pay | Admitting: Family Medicine

## 2018-01-31 VITALS — BP 110/60 | HR 141 | Temp 98.5°F | Wt 253.0 lb

## 2018-01-31 DIAGNOSIS — N183 Chronic kidney disease, stage 3 unspecified: Secondary | ICD-10-CM

## 2018-01-31 DIAGNOSIS — R0600 Dyspnea, unspecified: Secondary | ICD-10-CM

## 2018-01-31 DIAGNOSIS — E1121 Type 2 diabetes mellitus with diabetic nephropathy: Secondary | ICD-10-CM

## 2018-01-31 DIAGNOSIS — I4891 Unspecified atrial fibrillation: Secondary | ICD-10-CM | POA: Diagnosis not present

## 2018-01-31 DIAGNOSIS — R0609 Other forms of dyspnea: Secondary | ICD-10-CM | POA: Diagnosis not present

## 2018-01-31 DIAGNOSIS — R609 Edema, unspecified: Secondary | ICD-10-CM

## 2018-01-31 DIAGNOSIS — I10 Essential (primary) hypertension: Secondary | ICD-10-CM

## 2018-01-31 DIAGNOSIS — I509 Heart failure, unspecified: Secondary | ICD-10-CM

## 2018-01-31 DIAGNOSIS — E790 Hyperuricemia without signs of inflammatory arthritis and tophaceous disease: Secondary | ICD-10-CM | POA: Diagnosis not present

## 2018-01-31 DIAGNOSIS — E785 Hyperlipidemia, unspecified: Secondary | ICD-10-CM

## 2018-01-31 MED ORDER — METOPROLOL TARTRATE 25 MG PO TABS: 50.0000 mg | ORAL_TABLET | Freq: Two times a day (BID) | ORAL | 3 refills | Status: AC

## 2018-01-31 MED ORDER — TORSEMIDE 100 MG PO TABS: 100.0000 mg | ORAL_TABLET | Freq: Every day | ORAL | 0 refills | Status: AC

## 2018-01-31 NOTE — Patient Instructions (Addendum)
   Stop taking lisinopril   Stop taking furosemide   Change dose of metoprolol to 50mg  (2 x 25mg  tablets)  twice a day starting this evening   Stop aspirin   Start torsemide today, 100mg  once a day   Start Xarelto tomorrow morning, 20mg  once a day   Go to the emergency room if you have any chest pains, dizziness, or if your breathing gets worse.

## 2018-01-31 NOTE — Telephone Encounter (Signed)
Pt scheduled at [facility] for echo 02/07/18 at 10:00.Authorization [id number]

## 2018-01-31 NOTE — Progress Notes (Signed)
Patient: Crystal Pacheco Female    DOB: [birth year]   67 y.o.   MRN: [id number] Visit Date: 01/31/2018  Today's Provider: Lelon Huh, MD   Chief Complaint  Patient presents with  . Edema   Subjective:    HPI  Edema: Patient complains of edema. The location of the edema is lower leg(s) bilateral.  The edema has been severe.  Onset of symptoms was 2 weeks ago, gradually improving since that time.  Pt increased furosemide to 40mg  daily a few days ago and has since a little improvement in swelling.   The edema is present all day. The patient states the problem is long-standing.  The swelling has been n associated with shortness of breath. She denies having had any pains, pressure, or other discomfort   Wt Readings from Last 3 Encounters:  01/31/18 253 lb (114.8 kg)  05/19/17 293 lb (132.9 kg)  08/25/16 287 lb (130.2 kg)    No Known Allergies   Current Outpatient Medications:  .  allopurinol (ZYLOPRIM) 100 MG tablet, Take 1 tablet (100 mg total) by mouth daily., Disp: 90 tablet, Rfl: 4 .  Alpha-Lipoic Acid 100 MG CAPS, Take by mouth., Disp: , Rfl:  .  ALPRAZolam (XANAX) 0.25 MG tablet, TAKE 1/2-1 TABLET BY MOUTH TWICE DAILY AS NEEDED, Disp: 180 tablet, Rfl: 1 .  aspirin 81 MG tablet, Take 81 mg by mouth daily., Disp: , Rfl:  .  atorvastatin (LIPITOR) 80 MG tablet, TAKE 1 TABLET EVERY DAY, Disp: 90 tablet, Rfl: 4 .  colchicine 0.6 MG tablet, Take 2 tablets today, then one daily until gout is gone, Disp: 30 tablet, Rfl: 1 .  furosemide (LASIX) 20 MG tablet, TAKE 1 TO 2 TABLETS BY MOUTH EVERY DAY, Disp: 180 tablet, Rfl: 1 .  lisinopril (PRINIVIL,ZESTRIL) 10 MG tablet, Take 1 tablet (10 mg total) by mouth daily., Disp: 90 tablet, Rfl: 3 .  metFORMIN (GLUCOPHAGE-XR) 500 MG 24 hr tablet, TAKE 1 TABLET BY MOUTH EVERY DAY WITH BREAKFAST, Disp: 90 tablet, Rfl: 3 .  metoprolol tartrate (LOPRESSOR) 25 MG tablet, Take 1 tablet (25 mg total) by mouth 2 (two) times daily., Disp: 180  tablet, Rfl: 3 .  omeprazole (PRILOSEC) 20 MG capsule, TAKE 1 CAPSULE BY MOUTH TWO TIMES DAILY, Disp: 180 capsule, Rfl: 1 .  glucose blood test strip, Use as instructed, Disp: 100 each, Rfl: 12 .  NITROSTAT 0.4 MG SL tablet, DISSOLVE 1 TABLET UNDER TONGUE EVERY 3 TO 5 MINUTES AS NEEDED FOR CHEST PAIN (Patient not taking: Reported on 01/31/2018), Disp: 25 tablet, Rfl: 0  Review of Systems  Constitutional: Positive for fatigue and unexpected weight change (Pt reports she has lost 40 pounds since 05/19/2017). Negative for activity change, appetite change, chills, diaphoresis and fever.  Respiratory: Positive for cough and shortness of breath. Negative for apnea, choking, chest tightness, wheezing and stridor.   Cardiovascular: Positive for leg swelling. Negative for chest pain and palpitations.  Gastrointestinal: Negative.   Musculoskeletal: Positive for gait problem. Negative for arthralgias, back pain, joint swelling, myalgias, neck pain and neck stiffness.  Neurological: Negative for dizziness, light-headedness and headaches.    Social History   Tobacco Use  . Smoking status: Former Smoker    Packs/day: 1.00    Years: 20.00    Pack years: 20.00    Types: Cigarettes    Last attempt to quit: 11/15/2007    Years since quitting: 10.2  . Smokeless tobacco: Never Used  Substance Use  Topics  . Alcohol use: Yes    Alcohol/week: 2.0 standard drinks    Types: 2 Standard drinks or equivalent per week   Objective:   BP 108/62 (BP Location: Left Arm, Patient Position: Sitting, Cuff Size: Large)   Pulse (!) 141   Temp 98.5 F (36.9 C) (Oral)   Wt 253 lb (114.8 kg)   SpO2 94%   BMI 47.80 kg/m  Vitals:   01/31/18 1518  BP: 108/62  Pulse: (!) 141  Temp: 98.5 F (36.9 C)  TempSrc: Oral  SpO2: 94%  Weight: 253 lb (114.8 kg)     Physical Exam  General Appearance:    Alert, cooperative, no distress, obese  Eyes:    PERRL, conjunctiva/corneas clear, EOM's intact       Lungs:     Clear  to auscultation bilaterally, respirations unlabored  Heart:    Tachycardic,  Irregularly irregular rhythm. Normal rate  Neurologic:   Awake, alert, oriented x 3. No apparent focal neurological           defect.   Ext:   3+ bipedal edema.    EKG: ECG with RVR.     Assessment & Plan:     1. Atrial fibrillation, unspecified type [facility]): NEW ONSET Patient Instructions   Stop taking lisinopril   Stop taking furosemide   Change dose of metoprolol to 50mg  (2 x 25mg  tablets)  twice a day starting this evening   Stop aspirin   Start torsemide today, 100mg  once a day   Start Xarelto tomorrow morning, 20mg  once a day   Go to the emergency room if you have any chest pains, dizziness, or if your breathing gets worse.    - ECHOCARDIOGRAM COMPLETE; Future  2. Hyperuricemia Due to check - Uric acid  3. Diabetes mellitus with nephropathy (HCC)  - Hemoglobin A1c  4. Dyspnea on exertion Secondary to CHF - EKG 12-Lead - Brain natriuretic peptide - CBC  5. Essential hypertension Double betabocker due to tachycardia, stop lisinopril for now.  - metoprolol tartrate (LOPRESSOR) 25 MG tablet; Take 2 tablets (50 mg total) by mouth 2 (two) times daily.  Dispense: 3 tablet; Refill: 3  6. Chronic kidney disease (CKD), active medical management without dialysis, stage 3 (moderate) (HCC)  - Comprehensive metabolic panel  7. Hyperlipidemia, unspecified hyperlipidemia type She is tolerating atorvastatin well with no adverse effects.   - Lipid panel  8. Edema, unspecified type Secondary to chf - TSH  9. Congestive heart failure, unspecified HF chronicity, unspecified heart failure type ([place]) Likely secondary to atrial fibrillation - ECHOCARDIOGRAM COMPLETE; Future  Addressed multiple and complicated new and chronic conditions at today's visit. Over half of this 45 minute visit were spent in counseling and coordinating care of multiple medical problems.        Lelon Huh, MD    [facility]

## 2018-02-01 ENCOUNTER — Telehealth: Payer: Self-pay

## 2018-02-01 LAB — CBC
HEMOGLOBIN: 11.2 g/dL (ref 11.1–15.9)
Hematocrit: 34.6 % (ref 34.0–46.6)
MCH: 29.6 pg (ref 26.6–33.0)
MCHC: 32.4 g/dL (ref 31.5–35.7)
MCV: 91 fL (ref 79–97)
Platelets: 351 10*3/uL (ref 150–450)
RBC: 3.79 x10E6/uL (ref 3.77–5.28)
RDW: 13.1 % (ref 12.3–15.4)
WBC: 11.5 10*3/uL — ABNORMAL HIGH (ref 3.4–10.8)

## 2018-02-01 LAB — LIPID PANEL
CHOLESTEROL TOTAL: 185 mg/dL (ref 100–199)
Chol/HDL Ratio: 8 ratio — ABNORMAL HIGH (ref 0.0–4.4)
HDL: 23 mg/dL — ABNORMAL LOW (ref >39)
LDL CALC: 128 mg/dL — AB (ref 0–99)
Triglycerides: 169 mg/dL — ABNORMAL HIGH (ref 0–149)
VLDL Cholesterol Cal: 34 mg/dL (ref 5–40)

## 2018-02-01 LAB — URIC ACID: Uric Acid: 8.6 mg/dL — ABNORMAL HIGH (ref 2.5–7.1)

## 2018-02-01 LAB — COMPREHENSIVE METABOLIC PANEL
A/G RATIO: 1 — AB (ref 1.2–2.2)
ALT: 8 IU/L (ref 0–32)
AST: 14 IU/L (ref 0–40)
Albumin: 3.2 g/dL — ABNORMAL LOW (ref 3.6–4.8)
Alkaline Phosphatase: 100 IU/L (ref 39–117)
BUN/Creatinine Ratio: 23 (ref 12–28)
BUN: 22 mg/dL (ref 8–27)
Bilirubin Total: 1.1 mg/dL (ref 0.0–1.2)
CALCIUM: 8.5 mg/dL — AB (ref 8.7–10.3)
CO2: 22 mmol/L (ref 20–29)
Chloride: 96 mmol/L (ref 96–106)
Creatinine, Ser: 0.97 mg/dL (ref 0.57–1.00)
GFR calc non Af Amer: 60 mL/min/{1.73_m2} (ref >59)
GFR, EST AFRICAN AMERICAN: 69 mL/min/{1.73_m2} (ref >59)
GLOBULIN, TOTAL: 3.2 g/dL (ref 1.5–4.5)
Glucose: 155 mg/dL — ABNORMAL HIGH (ref 65–99)
POTASSIUM: 3.5 mmol/L (ref 3.5–5.2)
SODIUM: 137 mmol/L (ref 134–144)
TOTAL PROTEIN: 6.4 g/dL (ref 6.0–8.5)

## 2018-02-01 LAB — TSH: TSH: 1.68 u[IU]/mL (ref 0.450–4.500)

## 2018-02-01 LAB — HEMOGLOBIN A1C
Est. average glucose Bld gHb Est-mCnc: 223 mg/dL
Hgb A1c MFr Bld: 9.4 % — ABNORMAL HIGH (ref 4.8–5.6)

## 2018-02-01 LAB — BRAIN NATRIURETIC PEPTIDE: BNP: 671.8 pg/mL — ABNORMAL HIGH (ref 0.0–100.0)

## 2018-02-01 NOTE — Telephone Encounter (Signed)
-----   Message from Birdie Sons, MD sent at 02/01/2018  4:41 PM EST ----- Labs are consistent with congestive heart failure. Should improve with change in diuretic and increase dose of metoprolol. Need to schedule follow up Thursday or Friday to check check on heart rate and swelling.  Also, diabetes is out of control. Will need to discuss treatment options at her follow up visit.

## 2018-02-01 NOTE — Telephone Encounter (Signed)
Pt advised.  Apt made for Thursday.   Thanks,   -Mickel Baas

## 2018-02-03 ENCOUNTER — Encounter: Payer: Self-pay | Admitting: Family Medicine

## 2018-02-03 ENCOUNTER — Ambulatory Visit: Payer: Medicare Other | Admitting: Family Medicine

## 2018-02-03 VITALS — BP 92/60 | HR 71 | Temp 97.8°F | Resp 18 | Wt 248.0 lb

## 2018-02-03 DIAGNOSIS — I4891 Unspecified atrial fibrillation: Secondary | ICD-10-CM | POA: Diagnosis not present

## 2018-02-03 DIAGNOSIS — Z23 Encounter for immunization: Secondary | ICD-10-CM

## 2018-02-03 DIAGNOSIS — I509 Heart failure, unspecified: Secondary | ICD-10-CM | POA: Diagnosis not present

## 2018-02-03 DIAGNOSIS — I10 Essential (primary) hypertension: Secondary | ICD-10-CM

## 2018-02-03 DIAGNOSIS — E1121 Type 2 diabetes mellitus with diabetic nephropathy: Secondary | ICD-10-CM | POA: Diagnosis not present

## 2018-02-03 MED ORDER — POTASSIUM CHLORIDE CRYS ER 20 MEQ PO TBCR: 20.0000 meq | EXTENDED_RELEASE_TABLET | Freq: Every day | ORAL | 1 refills | Status: AC

## 2018-02-03 NOTE — Progress Notes (Signed)
Patient: Crystal Pacheco Female    DOB: 1951-02-04   67 y.o.   MRN: [id number] Visit Date: 02/03/2018  Today's Provider: Lelon Huh, MD   Chief Complaint  Patient presents with  . Follow-up   Subjective:    HPI Follow up atrial fibrillation Patient was last seen in the office 3 days ago for new onset CHF, A. Fib, Edema, Hypertension, and Diabetes. Changes made during the last visit includes changing diuretic from furosemide toTorsemide, stopping Lisinopril doubling dose of Metoprolol, and starting Xarelto. Today patient comes in reporting good compliance with these medication changes. She states she still has significant swelling in her lower legs, but is improved. Is urinated much more frequently. Still gets short of breath with exertion, but signifiantly improved from last visit. Denies having any chest pains or palpations.   Results for orders placed or performed in visit on 01/31/18  Comprehensive metabolic panel  Result Value Ref Range   Glucose 155 (H) 65 - 99 mg/dL   BUN 22 8 - 27 mg/dL   Creatinine, Ser 0.97 0.57 - 1.00 mg/dL   GFR calc non Af Amer 60 >59 mL/min/1.73   GFR calc Af Amer 69 >59 mL/min/1.73   BUN/Creatinine Ratio 23 12 - 28   Sodium 137 134 - 144 mmol/L   Potassium 3.5 3.5 - 5.2 mmol/L   Chloride 96 96 - 106 mmol/L   CO2 22 20 - 29 mmol/L   Calcium 8.5 (L) 8.7 - 10.3 mg/dL   Total Protein 6.4 6.0 - 8.5 g/dL   Albumin 3.2 (L) 3.6 - 4.8 g/dL   Globulin, Total 3.2 1.5 - 4.5 g/dL   Albumin/Globulin Ratio 1.0 (L) 1.2 - 2.2   Bilirubin Total 1.1 0.0 - 1.2 mg/dL   Alkaline Phosphatase 100 39 - 117 IU/L   AST 14 0 - 40 IU/L   ALT 8 0 - 32 IU/L  Lipid panel  Result Value Ref Range   Cholesterol, Total 185 100 - 199 mg/dL   Triglycerides 169 (H) 0 - 149 mg/dL   HDL 23 (L) >39 mg/dL   VLDL Cholesterol Cal 34 5 - 40 mg/dL   LDL Calculated 128 (H) 0 - 99 mg/dL   Chol/HDL Ratio 8.0 (H) 0.0 - 4.4 ratio  Hemoglobin A1c  Result Value Ref Range   Hgb A1c  MFr Bld 9.4 (H) 4.8 - 5.6 %   Est. average glucose Bld gHb Est-mCnc 223 mg/dL  Brain natriuretic peptide  Result Value Ref Range   BNP 671.8 (H) 0.0 - 100.0 pg/mL  CBC  Result Value Ref Range   WBC 11.5 (H) 3.4 - 10.8 x10E3/uL   RBC 3.79 3.77 - 5.28 x10E6/uL   Hemoglobin 11.2 11.1 - 15.9 g/dL   Hematocrit 34.6 34.0 - 46.6 %   MCV 91 79 - 97 fL   MCH 29.6 26.6 - 33.0 pg   MCHC 32.4 31.5 - 35.7 g/dL   RDW 13.1 12.3 - 15.4 %   Platelets 351 150 - 450 x10E3/uL  TSH  Result Value Ref Range   TSH 1.680 0.450 - 4.500 uIU/mL  Uric acid  Result Value Ref Range   Uric Acid 8.6 (H) 2.5 - 7.1 mg/dL   .     No Known Allergies   Current Outpatient Medications:  .  allopurinol (ZYLOPRIM) 100 MG tablet, Take 1 tablet (100 mg total) by mouth daily., Disp: 90 tablet, Rfl: 4 .  Alpha-Lipoic Acid 100 MG CAPS, Take  by mouth., Disp: , Rfl:  .  ALPRAZolam (XANAX) 0.25 MG tablet, TAKE 1/2-1 TABLET BY MOUTH TWICE DAILY AS NEEDED, Disp: 180 tablet, Rfl: 1 .  atorvastatin (LIPITOR) 80 MG tablet, TAKE 1 TABLET EVERY DAY, Disp: 90 tablet, Rfl: 4 .  colchicine 0.6 MG tablet, Take 2 tablets today, then one daily until gout is gone, Disp: 30 tablet, Rfl: 1 .  glucose blood test strip, Use as instructed, Disp: 100 each, Rfl: 12 .  metFORMIN (GLUCOPHAGE-XR) 500 MG 24 hr tablet, TAKE 1 TABLET BY MOUTH EVERY DAY WITH BREAKFAST, Disp: 90 tablet, Rfl: 3 .  metoprolol tartrate (LOPRESSOR) 25 MG tablet, Take 2 tablets (50 mg total) by mouth 2 (two) times daily., Disp: 3 tablet, Rfl: 3 .  NITROSTAT 0.4 MG SL tablet, DISSOLVE 1 TABLET UNDER TONGUE EVERY 3 TO 5 MINUTES AS NEEDED FOR CHEST PAIN (Patient not taking: Reported on 01/31/2018), Disp: 25 tablet, Rfl: 0 .  omeprazole (PRILOSEC) 20 MG capsule, TAKE 1 CAPSULE BY MOUTH TWO TIMES DAILY, Disp: 180 capsule, Rfl: 1 .  torsemide (DEMADEX) 100 MG tablet, Take 1 tablet (100 mg total) by mouth daily., Disp: 30 tablet, Rfl: 0  Review of Systems  Constitutional:  Positive for appetite change and fatigue. Negative for chills and fever.  Respiratory: Positive for shortness of breath. Negative for chest tightness.   Cardiovascular: Positive for leg swelling. Negative for chest pain and palpitations.  Gastrointestinal: Negative for abdominal pain, nausea and vomiting.  Neurological: Positive for weakness. Negative for dizziness.  Hematological: Bruises/bleeds easily.    Social History   Tobacco Use  . Smoking status: Former Smoker    Packs/day: 1.00    Years: 20.00    Pack years: 20.00    Types: Cigarettes    Last attempt to quit: 11/15/2007    Years since quitting: 10.2  . Smokeless tobacco: Never Used  Substance Use Topics  . Alcohol use: Yes    Alcohol/week: 2.0 standard drinks    Types: 2 Standard drinks or equivalent per week   Objective:   BP 92/60 (BP Location: Left Arm, Patient Position: Sitting, Cuff Size: Large)   Pulse 71   Temp 97.8 F (36.6 C) (Oral)   Resp 18   Wt 248 lb (112.5 kg)   BMI 46.86 kg/m  Vitals:   02/03/18 1113  BP: 92/60  Pulse: 71  Resp: 18  Temp: 97.8 F (36.6 C)  TempSrc: Oral  Weight: 248 lb (112.5 kg)     Physical Exam  General Appearance:    Alert, cooperative, no distress, obese  Eyes:    PERRL, conjunctiva/corneas clear, EOM's intact       Lungs:     Clear to auscultation bilaterally, respirations unlabored  Heart:  , Irregularly irregular rhythm. Normal rate.3+ bipedal edema.   Neurologic:   Awake, alert, oriented x 3. No apparent focal neurological           defect.      EKG: a-fib VR=112     Assessment & Plan:     1. Atrial fibrillation, unspecified type ([place]) Recent onset. Much better hr control on 50mg  metoprolol BID. BP much lower. Keep lisinopril on hold. Continue xarelto. Continue 100mg  torsemide daily for now. Encouraged to keep feet elevated when not ambulating. Add 34meq daily potassium echo scheduled for 11-18. Follow up o.v. 1 week.  - EKG 12-Lead  2. Need for influenza  vaccination  - Flu vaccine HIGH DOSE PF (Fluzone High dose)  3. Congestive heart  failure, unspecified HF chronicity, unspecified heart failure type ([place]) Improving. Continue current medications.  Consider verapamil if EF is normal.   4. Diabetes mellitus with nephropathy (HCC) Uncontrolled. Would like to start SGLT-2, but is relatively hypotensive at this time.   5. Essential hypertension Low since double metoprolol and changing to torsemide. Keep lisinopril on hold.       Lelon Huh, MD  [facility]

## 2018-02-07 ENCOUNTER — Telehealth: Payer: Self-pay | Admitting: Family Medicine

## 2018-02-07 ENCOUNTER — Ambulatory Visit
Admission: RE | Admit: 2018-02-07 | Discharge: 2018-02-07 | Disposition: A | Discharge status: Home / Self Care | Payer: Medicare Other | Source: Ambulatory Visit | Attending: Family Medicine | Admitting: Family Medicine

## 2018-02-07 DIAGNOSIS — D649 Anemia, unspecified: Secondary | ICD-10-CM | POA: Insufficient documentation

## 2018-02-07 DIAGNOSIS — F419 Anxiety disorder, unspecified: Secondary | ICD-10-CM | POA: Insufficient documentation

## 2018-02-07 DIAGNOSIS — K219 Gastro-esophageal reflux disease without esophagitis: Secondary | ICD-10-CM | POA: Diagnosis not present

## 2018-02-07 DIAGNOSIS — I340 Nonrheumatic mitral (valve) insufficiency: Secondary | ICD-10-CM | POA: Diagnosis not present

## 2018-02-07 DIAGNOSIS — I509 Heart failure, unspecified: Secondary | ICD-10-CM | POA: Diagnosis not present

## 2018-02-07 DIAGNOSIS — J449 Chronic obstructive pulmonary disease, unspecified: Secondary | ICD-10-CM | POA: Diagnosis not present

## 2018-02-07 DIAGNOSIS — I252 Old myocardial infarction: Secondary | ICD-10-CM | POA: Diagnosis not present

## 2018-02-07 DIAGNOSIS — I2510 Atherosclerotic heart disease of native coronary artery without angina pectoris: Secondary | ICD-10-CM | POA: Insufficient documentation

## 2018-02-07 DIAGNOSIS — I4891 Unspecified atrial fibrillation: Secondary | ICD-10-CM

## 2018-02-07 DIAGNOSIS — Z8673 Personal history of transient ischemic attack (TIA), and cerebral infarction without residual deficits: Secondary | ICD-10-CM | POA: Insufficient documentation

## 2018-02-07 NOTE — Progress Notes (Signed)
*  PRELIMINARY RESULTS* Echocardiogram 2D Echocardiogram has been performed.  Crystal Pacheco 02/07/2018, 10:34 AM

## 2018-02-07 NOTE — Telephone Encounter (Signed)
Order for cardiology referral for atrial fibrillation

## 2018-02-08 ENCOUNTER — Telehealth: Payer: Self-pay

## 2018-02-08 NOTE — Telephone Encounter (Signed)
Pt advised.   Thanks,   -Lavetta Geier  

## 2018-02-08 NOTE — Telephone Encounter (Signed)
-----   Message from Birdie Sons, MD sent at 02/07/2018  9:04 PM EST ----- Echocardiogram shows mildly reduced heart function. Continue current medications. Need referral to cardiology for new onset atrial fibrillation. Have sent referral order to [facility].

## 2018-02-10 ENCOUNTER — Encounter: Payer: Self-pay | Admitting: Nurse Practitioner

## 2018-02-10 ENCOUNTER — Encounter: Payer: Self-pay | Admitting: Family Medicine

## 2018-02-10 ENCOUNTER — Ambulatory Visit (INDEPENDENT_AMBULATORY_CARE_PROVIDER_SITE_OTHER): Payer: Medicare Other | Admitting: Nurse Practitioner

## 2018-02-10 ENCOUNTER — Ambulatory Visit: Payer: Medicare Other | Admitting: Family Medicine

## 2018-02-10 VITALS — BP 92/58 | HR 84 | Temp 97.7°F | Resp 16 | Wt 225.8 lb

## 2018-02-10 VITALS — BP 88/60 | HR 135 | Ht 64.0 in | Wt 225.8 lb

## 2018-02-10 DIAGNOSIS — I2510 Atherosclerotic heart disease of native coronary artery without angina pectoris: Secondary | ICD-10-CM | POA: Diagnosis not present

## 2018-02-10 DIAGNOSIS — R531 Weakness: Secondary | ICD-10-CM | POA: Diagnosis not present

## 2018-02-10 DIAGNOSIS — Z01810 Encounter for preprocedural cardiovascular examination: Secondary | ICD-10-CM | POA: Diagnosis not present

## 2018-02-10 DIAGNOSIS — I4891 Unspecified atrial fibrillation: Secondary | ICD-10-CM

## 2018-02-10 DIAGNOSIS — I739 Peripheral vascular disease, unspecified: Secondary | ICD-10-CM | POA: Diagnosis not present

## 2018-02-10 DIAGNOSIS — E1121 Type 2 diabetes mellitus with diabetic nephropathy: Secondary | ICD-10-CM | POA: Diagnosis not present

## 2018-02-10 DIAGNOSIS — R0600 Dyspnea, unspecified: Secondary | ICD-10-CM

## 2018-02-10 DIAGNOSIS — I509 Heart failure, unspecified: Secondary | ICD-10-CM

## 2018-02-10 DIAGNOSIS — R0609 Other forms of dyspnea: Secondary | ICD-10-CM

## 2018-02-10 DIAGNOSIS — I5043 Acute on chronic combined systolic (congestive) and diastolic (congestive) heart failure: Secondary | ICD-10-CM

## 2018-02-10 MED ORDER — RIVAROXABAN 20 MG PO TABS: 20.0000 mg | ORAL_TABLET | Freq: Every day | ORAL | 3 refills | Status: AC

## 2018-02-10 NOTE — Progress Notes (Signed)
Patient: Crystal Pacheco Female    DOB: June 16, 1950   67 y.o.   MRN: [id number] Visit Date: 02/10/2018  Today's Provider: Lelon Huh, MD   Chief Complaint  Patient presents with  . Atrial Fibrillation   Subjective:    HPI     Follow up for Atrial Fibrillation  The patient was last seen for this 1 weeks ago. Changes made at last visit include continuing to hold Lisinopril. Continue Xarelto and Toresmide, prescribed Potassium 11meq which she has not yet started.   echocardiogram, that was ordered showed mildly reduced EF of 45-50% and bilateral atrium at upper limits of normal size.  Cardiology appointment scheduled for today at [facility] to follow up.   She reports good compliance with treatment, patient states that she has of yet to start Potassium  She feels that condition is Unchanged. She is not having side effects.   She states that breathing has greatly improved since changing furosemide to torsemide, but she feels so weak in her legs she can only walk a few feet. Swelling in legs has improved somewhat, but still remains very edematous. Has had no chest pains or palpitations.    Wt Readings from Last 3 Encounters:  02/10/18 225 lb 12.8 oz (102.4 kg)  02/03/18 248 lb (112.5 kg)  01/31/18 253 lb (114.8 kg)   Results for orders placed or performed in visit on 01/31/18  Comprehensive metabolic panel  Result Value Ref Range   Glucose 155 (H) 65 - 99 mg/dL   BUN 22 8 - 27 mg/dL   Creatinine, Ser 0.97 0.57 - 1.00 mg/dL   GFR calc non Af Amer 60 >59 mL/min/1.73   GFR calc Af Amer 69 >59 mL/min/1.73   BUN/Creatinine Ratio 23 12 - 28   Sodium 137 134 - 144 mmol/L   Potassium 3.5 3.5 - 5.2 mmol/L   Chloride 96 96 - 106 mmol/L   CO2 22 20 - 29 mmol/L   Calcium 8.5 (L) 8.7 - 10.3 mg/dL   Total Protein 6.4 6.0 - 8.5 g/dL   Albumin 3.2 (L) 3.6 - 4.8 g/dL   Globulin, Total 3.2 1.5 - 4.5 g/dL   Albumin/Globulin Ratio 1.0 (L) 1.2 - 2.2   Bilirubin Total 1.1 0.0 - 1.2 mg/dL     Alkaline Phosphatase 100 39 - 117 IU/L   AST 14 0 - 40 IU/L   ALT 8 0 - 32 IU/L  Lipid panel  Result Value Ref Range   Cholesterol, Total 185 100 - 199 mg/dL   Triglycerides 169 (H) 0 - 149 mg/dL   HDL 23 (L) >39 mg/dL   VLDL Cholesterol Cal 34 5 - 40 mg/dL   LDL Calculated 128 (H) 0 - 99 mg/dL   Chol/HDL Ratio 8.0 (H) 0.0 - 4.4 ratio  Hemoglobin A1c  Result Value Ref Range   Hgb A1c MFr Bld 9.4 (H) 4.8 - 5.6 %   Est. average glucose Bld gHb Est-mCnc 223 mg/dL  Brain natriuretic peptide  Result Value Ref Range   BNP 671.8 (H) 0.0 - 100.0 pg/mL  CBC  Result Value Ref Range   WBC 11.5 (H) 3.4 - 10.8 x10E3/uL   RBC 3.79 3.77 - 5.28 x10E6/uL   Hemoglobin 11.2 11.1 - 15.9 g/dL   Hematocrit 34.6 34.0 - 46.6 %   MCV 91 79 - 97 fL   MCH 29.6 26.6 - 33.0 pg   MCHC 32.4 31.5 - 35.7 g/dL   RDW 13.1 12.3 -  15.4 %   Platelets 351 150 - 450 x10E3/uL  TSH  Result Value Ref Range   TSH 1.680 0.450 - 4.500 uIU/mL  Uric acid  Result Value Ref Range   Uric Acid 8.6 (H) 2.5 - 7.1 mg/dL    ------------------------------------------------------------------------------------    No Known Allergies   Current Outpatient Medications:  .  allopurinol (ZYLOPRIM) 100 MG tablet, Take 1 tablet (100 mg total) by mouth daily., Disp: 90 tablet, Rfl: 4 .  Alpha-Lipoic Acid 100 MG CAPS, Take by mouth., Disp: , Rfl:  .  ALPRAZolam (XANAX) 0.25 MG tablet, TAKE 1/2-1 TABLET BY MOUTH TWICE DAILY AS NEEDED, Disp: 180 tablet, Rfl: 1 .  atorvastatin (LIPITOR) 80 MG tablet, TAKE 1 TABLET EVERY DAY, Disp: 90 tablet, Rfl: 4 .  colchicine 0.6 MG tablet, Take 2 tablets today, then one daily until gout is gone, Disp: 30 tablet, Rfl: 1 .  glucose blood test strip, Use as instructed, Disp: 100 each, Rfl: 12 .  metFORMIN (GLUCOPHAGE-XR) 500 MG 24 hr tablet, TAKE 1 TABLET BY MOUTH EVERY DAY WITH BREAKFAST, Disp: 90 tablet, Rfl: 3 .  metoprolol tartrate (LOPRESSOR) 25 MG tablet, Take 2 tablets (50 mg total) by mouth  2 (two) times daily., Disp: 3 tablet, Rfl: 3 .  NITROSTAT 0.4 MG SL tablet, DISSOLVE 1 TABLET UNDER TONGUE EVERY 3 TO 5 MINUTES AS NEEDED FOR CHEST PAIN, Disp: 25 tablet, Rfl: 0 .  omeprazole (PRILOSEC) 20 MG capsule, TAKE 1 CAPSULE BY MOUTH TWO TIMES DAILY, Disp: 180 capsule, Rfl: 1 .  potassium chloride SA (K-DUR,KLOR-CON) 20 MEQ tablet, Take 1 tablet (20 mEq total) by mouth daily., Disp: 30 tablet, Rfl: 1 .  torsemide (DEMADEX) 100 MG tablet, Take 1 tablet (100 mg total) by mouth daily., Disp: 30 tablet, Rfl: 0  Review of Systems  Constitutional: Negative.   HENT: Negative.   Eyes: Negative.   Respiratory: Negative.   Cardiovascular: Positive for leg swelling. Negative for chest pain and palpitations.  Endocrine: Negative.   Musculoskeletal: Negative for arthralgias, back pain, gait problem, joint swelling and myalgias.  Skin: Negative.     Social History   Tobacco Use  . Smoking status: Former Smoker    Packs/day: 1.00    Years: 20.00    Pack years: 20.00    Types: Cigarettes    Last attempt to quit: 11/15/2007    Years since quitting: 10.2  . Smokeless tobacco: Never Used  Substance Use Topics  . Alcohol use: Yes    Alcohol/week: 2.0 standard drinks    Types: 2 Standard drinks or equivalent per week   Objective:    Vitals:   02/10/18 1129 02/10/18 1146  BP: (!) 92/58   Pulse: (!) 141 84 (irregular)  Resp: 16   Temp: 97.7 F (36.5 C)   TempSrc: Oral   SpO2: 95%   Weight: 225 lb 12.8 oz (102.4 kg)      Physical Exam  General Appearance:    Alert, cooperative, no distress, obese  Eyes:    PERRL, conjunctiva/corneas clear, EOM's intact       Lungs:     Clear to auscultation bilaterally, respirations unlabored  Heart:   Irregularly irregular rhythm. Normal rate   Ext::   3+ bipedal edema.         Assessment & Plan:     1. Atrial fibrillation, unspecified type ([place]) Recent onset. Have since changed furosemide to torsemide and doubled betablocker. Remains in  a-fib, but rate much better. Tolerating xarelto  well. To follow up with cardiology today to discuss long term management.  If off of her ACEI due to hypotension. Strongly advised to start potassium supplement that was prescribed to her.  - rivaroxaban (XARELTO) 20 MG TABS tablet; Take 1 tablet (20 mg total) by mouth daily with supper.  Dispense: 30 tablet; Refill: 3 - Comprehensive metabolic panel  2. Acute congestive heart failure, unspecified heart failure type ([place]) Secondary to a-fib as above.  - Magnesium  3. Weakness Likely secondary to diuresis and beta-blockade. Strongly encourage to start potassium supplement, check CMP and magnesium today.   4. Diabetes mellitus with nephropathy ([place]) Uncontrolled due to not taking metformin for several months. Discussed trial of Jardiance of Victoza. Will see how electrolytes look before starting new medication.   5. Dyspnea on exertion Improved with change in diuretics. .  - Brain natriuretic peptide        Lelon Huh, MD  [facility]

## 2018-02-10 NOTE — Progress Notes (Signed)
Cardiology Clinic Note   Patient Name: Crystal Pacheco Date of Encounter: 02/10/2018  Primary Care Provider:  Birdie Sons, MD Primary Cardiologist:  Ida Rogue, MD  Patient Profile    67 year old female with a history of CAD, chronic combined heart failure, hypertension, hyperlipidemia, remote tobacco abuse, COPD, prior stroke, and diabetes, who presents for follow-up in the setting of recent diagnosis of persistent atrial fibrillation and worsened heart failure.  Past Medical History    Past Medical History:  Diagnosis Date  . Anemia   . Anxiety   . Cerebral infarction ([place]) 2006  . Chronic combined systolic (congestive) and diastolic (congestive) heart failure ([place])    a. 08/2005 MV: EF 42%; b. 01/2018 Echo: EF 45-50%. Mild to mod MR.  Marland Kitchen COPD (chronic obstructive pulmonary disease) ([place])   . Coronary artery disease 2006   a. 01/2005 PCI: DES->OM. RCA 100p. Complicated by groin PSA; b 08/2005 MV: EF 42%, anterior and high lateral scar w/ peri-inf ischemia.  . Essential hypertension   . GERD (gastroesophageal reflux disease)   . MI (myocardial infarction) ([place]) 2006  . Persistent atrial fibrillation    a. Dx 01/2018. CHA2DS2VASc = 8-->Xarelto.  . Type II diabetes mellitus ([place])    Past Surgical History:  Procedure Laterality Date  . CARDIAC CATHETERIZATION    . Carotid surgery stent    . CORONARY ANGIOPLASTY WITH STENT PLACEMENT  02/10/2005   Paclitaxel Eluting Taxus Express 2 Monorail 2.75 x 90mm left Cx-marg.  Marland Kitchen NM MYOVIEW LTD     Myoview EF= 42% High lateral wall defect     Allergies  No Known Allergies  History of Present Illness    67 year old female with the above complex past medical history including coronary artery disease status post PCI and stenting to the obtuse marginal in 2006.  She was also found to have a total occlusion of the RCA at that time.  Post procedure course was complicated by a pseudoaneurysm.  Her other history includes  hypertension, hyperlipidemia, diabetes, remote tobacco abuse, COPD, and stroke.  Patient was previously followed by Dr. Rockey Situ but has not been seen in cardiology clinic since 2015.  In the interim, she has been followed closely by her primary care provider.  She says that she has not experienced chest pain over the past 4 years but does have some degree of chronic dyspnea on exertion.  She also says that over a period of about 3 to 6 months, she has noted progressive bilateral lower extremity claudication involving her calves.  She says her legs now hurt so badly that she can only walk about 7 to 10 feet before having to stop and rest for about 3 minutes for symptoms to resolve.  She has not had any rest pain or skin breakdown.  In addition to progressive claudication, a proximally 1 month ago, she began to notice increasing lower extremity swelling and fatigue.  She is not sure if she has had any worsening in dyspnea, as her activity has been so limited.  She was seen by her primary care provider and found to be tachycardic with a rate of 141.  ECG showed atrial fibrillation with rapid ventricular response.  Her metoprolol dose was increased, lisinopril discontinued in the setting of soft blood pressure, and Lasix was changed to torsemide.  In the setting of a CHA2DS2VASc of 8, she was placed on Xarelto 20 mg daily.  Echocardiogram was ordered.  When seen at follow-up on November 14, her weight  was down 5 pounds.  Labs from the 11th were reviewed and she was hypokalemic, potassium was started.  Echocardiogram was performed November 18 showing mild LV dysfunction with an EF of 45 to 50%.  This was somewhat similar to what was seen on nuclear study in 2007.  With continued diuresis over the past week, her weight is now down to 225 pounds - 28 pounds since November 11.  She remains in atrial fibrillation with a rate of 135 today.  She denies palpitations or chest pain.  She says her breathing has been stable but  again, activity has been limited.  Her lower extremity swelling has improved and she says that at this point, she mostly notices it is pedal edema as the day progresses, but it is not necessarily their first thing in the morning.  She denies PND, orthopnea, dizziness, syncope, or early satiety.  Home Medications    Prior to Admission medications   Medication Sig Start Date End Date Taking? Authorizing Provider  allopurinol (ZYLOPRIM) 100 MG tablet Take 1 tablet (100 mg total) by mouth daily. 05/20/17  Yes Birdie Sons, MD  Alpha-Lipoic Acid 100 MG CAPS Take by mouth.   Yes [provider]  ALPRAZolam Duanne Moron) 0.25 MG tablet TAKE 1/2-1 TABLET BY MOUTH TWICE DAILY AS NEEDED 05/21/17  Yes Birdie Sons, MD  atorvastatin (LIPITOR) 80 MG tablet TAKE 1 TABLET EVERY DAY 05/20/17  Yes Birdie Sons, MD  colchicine 0.6 MG tablet Take 2 tablets today, then one daily until gout is gone 05/19/17  Yes Birdie Sons, MD  glucose blood test strip Use as instructed 11/14/14  Yes Birdie Sons, MD  metFORMIN (GLUCOPHAGE-XR) 500 MG 24 hr tablet TAKE 1 TABLET BY MOUTH EVERY DAY WITH BREAKFAST 05/19/17  Yes Birdie Sons, MD  metoprolol tartrate (LOPRESSOR) 25 MG tablet Take 2 tablets (50 mg total) by mouth 2 (two) times daily. 01/31/18  Yes Birdie Sons, MD  NITROSTAT 0.4 MG SL tablet DISSOLVE 1 TABLET UNDER TONGUE EVERY 3 TO 5 MINUTES AS NEEDED FOR CHEST PAIN 11/12/14  Yes Birdie Sons, MD  omeprazole (PRILOSEC) 20 MG capsule TAKE 1 CAPSULE BY MOUTH TWO TIMES DAILY 05/21/17  Yes Birdie Sons, MD  potassium chloride SA (K-DUR,KLOR-CON) 20 MEQ tablet Take 1 tablet (20 mEq total) by mouth daily. 02/03/18  Yes Birdie Sons, MD  rivaroxaban (XARELTO) 20 MG TABS tablet Take 1 tablet (20 mg total) by mouth daily with supper. 02/10/18  Yes Birdie Sons, MD  torsemide (DEMADEX) 100 MG tablet Take 1 tablet (100 mg total) by mouth daily. 01/31/18  Yes Birdie Sons, MD    Family  History    Family History  Problem Relation Age of Onset  . CAD Mother        CABG in her 29's   She indicated that her mother is deceased. She indicated that her father is deceased. She indicated that her sister is deceased.  Social History    Social History   Socioeconomic History  . Marital status: Married    Spouse name: Not on file  . Number of children: 1  . Years of education: Not on file  . Highest education level: Not on file  Occupational History  . Occupation: Retired  Scientific laboratory technician  . Financial resource strain: Not on file  . Food insecurity:    Worry: Not on file    Inability: Not on file  . Transportation needs:  Medical: Not on file    Non-medical: Not on file  Tobacco Use  . Smoking status: Former Smoker    Packs/day: 1.00    Years: 20.00    Pack years: 20.00    Types: Cigarettes    Last attempt to quit: 11/15/2007    Years since quitting: 10.2  . Smokeless tobacco: Never Used  Substance and Sexual Activity  . Alcohol use: Yes    Alcohol/week: 2.0 standard drinks    Types: 2 Standard drinks or equivalent per week    Comment: occasional cocktail.  . Drug use: No  . Sexual activity: Not on file  Lifestyle  . Physical activity:    Days per week: Not on file    Minutes per session: Not on file  . Stress: Not on file  Relationships  . Social connections:    Talks on phone: Not on file    Gets together: Not on file    Attends religious service: Not on file    Active member of club or organization: Not on file    Attends meetings of clubs or organizations: Not on file    Relationship status: Not on file  . Intimate partner violence:    Fear of current or ex partner: Not on file    Emotionally abused: Not on file    Physically abused: Not on file    Forced sexual activity: Not on file  Other Topics Concern  . Not on file  Social History Narrative   Lives in [place] with husband.  Does not routinely exercise.  Activity limited by progressive  LE claudication.     Review of Systems    General:  +++ fatigue. No chills, fever, night sweats or weight changes.  Cardiovascular:  No chest pain, +++ dyspnea on exertion, +++ edema, no orthopnea, palpitations, paroxysmal nocturnal dyspnea. +++ bilat LE claudication (calves). Dermatological: No rash, lesions/masses Respiratory: No cough, +++ dyspnea Urologic: No hematuria, dysuria Abdominal:   No nausea, vomiting, diarrhea, bright red blood per rectum, melena, or hematemesis Neurologic:  No visual changes, wkns, changes in mental status. All other systems reviewed and are otherwise negative except as noted above.  Physical Exam    VS:  BP (!) 88/60 (BP Location: Left Arm, Patient Position: Sitting, Cuff Size: Normal)   Pulse (!) 135   Ht 5\' 4"  (1.626 m)   Wt 225 lb 12.8 oz (102.4 kg)   BMI 38.76 kg/m  , BMI Body mass index is 38.76 kg/m. GEN: Well nourished, well developed, in no acute distress. HEENT: normal. Neck: Supple, no JVD, carotid bruits, or masses. Cardiac: IR, IR, tachy, no murmurs, rubs, or gallops. No clubbing, cyanosis.  1+ bilat ankle edema w/ 3+ bilat pedal edema.  Radials/DP/PT 1+ and equal bilaterally.  Respiratory:  Respirations regular and unlabored, diminished breath sounds at bases bilaterally.  Otherwise clear to auscultation.   GI: Soft, nontender, nondistended, BS + x 4. MS: no deformity or atrophy. Skin: warm and dry, no rash. Neuro:  Strength and sensation are intact. Psych: Normal affect.  Accessory Clinical Findings    ECG personally reviewed by me today-atrial fibrillation, 135, nonspecific T changes- No acute changes  Lab Results  Component Value Date   WBC 11.5 (H) 01/31/2018   HGB 11.2 01/31/2018   HCT 34.6 01/31/2018   MCV 91 01/31/2018   PLT 351 01/31/2018   Lab Results  Component Value Date   CREATININE 0.97 01/31/2018   BUN 22 01/31/2018   NA 137  01/31/2018   K 3.5 01/31/2018   CL 96 01/31/2018   CO2 22 01/31/2018   Lab  Results  Component Value Date   TSH 1.680 01/31/2018     Assessment & Plan   1.  Persistent atrial fibrillation with rapid ventricular response: Patient presents with about a 1 month history of progressive fatigue and lower extremity swelling.  She was found to be in rapid atrial fibrillation on November 11.  Labs at that time showed mild hypokalemia with normal thyroid function.  Beta-blocker dosage was increased at that time and she was also placed on Xarelto in the setting a CHA2DS2VASc of 8.  In the setting of volume overload, she was switched over to torsemide and has had significant response and improvement in lower extremity swelling and dyspnea since then.  She remains in rapid atrial fibrillation today at a rate of 135.  Her blood pressure is soft at 88/60, though she is asymptomatic and denies palpitations, chest pain, or presyncope.  Recent echo showed an EF of 45 to 50% which is stable to what was seen on Myoview in 2007.  She continues to have pedal edema but overall, her weight is down 25 pounds since being placed on torsemide November 11.  She will have follow-up CBC and basic metabolic panel today.  We discussed options for management of her atrial fibrillation to potentially include 2 additional weeks of Xarelto therapy prior to proceeding cardioversion versus TEE and cardioversion sooner.  I am concerned that given the significant amount of volume overload that she experienced likely as result of rapid atrial fibrillation, with soft blood pressures and thus an inability to titrate medications to bring her rates down, that we need to be more aggressive.  As a result, I have gotten her scheduled for TEE and cardioversion on Monday, November 25.  She will remain on metoprolol and I have counseled her on the importance of not missing any Xarelto doses.  The risks and benefits of transesophageal echocardiogram/cardioversion have been explained including risks of esophageal damage, perforation  (1:10,000 risk), bleeding, pharyngeal hematoma as well as other potential complications associated with conscious sedation including aspiration, arrhythmia, respiratory failure and death. Alternatives to treatment were discussed, questions were answered. Patient is willing to proceed.   2.  Bilateral lower extremity claudication: Patient reports a several month history of progressive lower extremity claudication including calf pain that now results when walking just 7 to 10 feet.  She does have weak distal pulses.  Color and capillary refill are good.  I will arrange for arterial brachial indices.  She is on statin therapy.  No aspirin in the setting of Xarelto.  3.  Acute on chronic combined systolic and diastolic congestive heart failure: EF 42% by SPECT in 2007.  I cannot find an echo between now and then.  Most recent echo November 11 shows an EF of 45 to 50%.  She developed significant volume overload over the past month in the setting of rapid atrial fibrillation.  I suspect this is primarily secondary to loss of atrial kick.  She has diuresed significantly, down 25 pounds since starting on 100 mrem of torsemide daily on November 11.  Following up basic metabolic panel today.  She does have significant pedal edema still however this appears to be primarily dependent in nature and hopefully we can address this by keeping her legs elevated and watching salt intake as opposed to any further escalation of diuretic.  As above, I think most importantly we  will need to restore sinus rhythm.  TEE and cardioversion on Monday.  Plan to follow-up echocardiogram in a few months after restoration of sinus rhythm.   4.  Coronary artery disease: Despite rapid rates and volume overload, she has not been having chest pain.  She remains on statin and beta-blocker therapy.  As above, no aspirin in the setting of Xarelto.   5.  Essential hypertension: She is actually hypotensive today though is asymptomatic.  Continue  beta-blockers in the setting of rapid atrial fibrillation.  6.  Hyperlipidemia: Continue statin therapy.  LDL was 128 earlier this year.  She may require Zetia therapy in the future.  7.  Type 2 diabetes mellitus: On metformin and being followed closely by primary care.  A1c was 9.4 recently.  8.  Disposition: Labs today.  TEE cardioversion on Monday.  Follow-up ECG in approximately 10 days.  Follow-up in clinic in approximately 2 to 3 weeks.  Murray Hodgkins, NP 02/10/2018, 4:47 PM

## 2018-02-10 NOTE — Patient Instructions (Addendum)
Medication Instructions:  Your physician recommends that you continue on your current medications as directed. Please refer to the Current Medication list given to you today.  If you need a refill on your cardiac medications before your next appointment, please call your pharmacy.   Lab work: Your physician recommends that you return for lab work in: TODAY - [place], BMET.  If you have labs (blood work) drawn today and your tests are completely normal, you will receive your results only by: Marland Kitchen MyChart Message (if you have MyChart) OR . A paper copy in the mail If you have any lab test that is abnormal or we need to change your treatment, we will call you to review the results.  Testing/Procedures: 1-  Your physician has requested that you have an ankle brachial index (ABI). During this test an ultrasound and blood pressure cuff are used to evaluate the arteries that supply the arms and legs with blood. Allow thirty minutes for this exam. There are no restrictions or special instructions.  2-  Your physician has requested that you have a lower extremity arterial doppler- During this test, ultrasound is used to evaluate arterial blood flow in the legs. Allow approximately one hour for this exam.      3-  Your physician has requested that you have a TEE/Cardioversion. During a TEE, sound waves are used to create images of your heart. It provides your doctor with information about the size and shape of your heart and how well your heart's chambers and valves are working. In this test, a transducer is attached to the end of a flexible tube that is guided down you throat and into your esophagus (the tube leading from your mouth to your stomach) to get a more detailed image of your heart. Once the TEE has determined that a blood clot is not present, the cardioversion begins. Electrical Cardioversion uses a jolt of electricity to your heart either through paddles or wired patches attached to your chest. This  is a controlled, usually prescheduled, procedure. This procedure is done at the hospital and you are not awake during the procedure. You usually go home the day of the procedure. Please see the instruction sheet given to you today for more information.  You are scheduled for a TEE WITH Cardioversion on ___11/21/2019____ with Dr.__ARIDA__ Please arrive at the [place] [facility] at __06:30___ a.m. on the day of your procedure.  DIET INSTRUCTIONS:  Nothing to eat or drink after midnight except your medications with sip of water. DO NOT TAKE YOUR METFORMIN THE MORNING OF THE PROCEDURE.         1) Labs: __TODAY_______  2) Medications:  YOU MAY TAKE ALL of your remaining medications with a small amount of water.  3) Must have a responsible person to drive you home.  4) Bring a current list of your medications and current insurance cards.    If you have any questions after you get home, please call the office at [phone]   Follow-Up: Your physician recommends that you schedule a follow-up appointment in: [place] ON 02/14/18.    At [facility], you and your health needs are our priority.  As part of our continuing mission to provide you with exceptional heart care, we have created designated Provider Care Teams.  These Care Teams include your primary Cardiologist (physician) and Advanced Practice Providers (APPs -  Physician Assistants and Nurse Practitioners) who all work together to provide you with the  care you need, when you need it. You will need a follow up appointment in 1 MONTH. You may see DR Ida Rogue or one of the following Advanced Practice Providers on your designated Care Team:   Murray Hodgkins, NP Christell Faith, PA-C . Marrianne Mood, PA-C     Transesophageal Echocardiogram Transesophageal echocardiography (TEE) is a picture test of your heart using sound waves. The pictures taken can give very detailed pictures of your heart. This can  help your doctor see if there are problems with your heart. TEE can check:  If your heart has blood clots in it.  How well your heart valves are working.  If you have an infection on the inside of your heart.  Some of the major arteries of your heart.  If your heart valve is working after a Office manager.  Your heart before a procedure that uses a shock to your heart to get the rhythm back to normal.  What happens before the procedure?  Do not eat or drink for 6 hours before the procedure or as told by your doctor.  Make plans to have someone drive you home after the procedure. Do not drive yourself home.  An IV tube will be put in your arm. What happens during the procedure?  You will be given a medicine to help you relax (sedative). It will be given through the IV tube.  A numbing medicine will be sprayed or gargled in the back of your throat to help numb it.  The tip of the probe is placed into the back of your mouth. You will be asked to swallow. This helps to pass the probe into your esophagus.  Once the tip of the probe is in the right place, your doctor can take pictures of your heart.  You may feel pressure at the back of your throat. What happens after the procedure?  You will be taken to a recovery area so the sedative can wear off.  Your throat may be sore and scratchy. This will go away slowly over time.  You will go home when you are fully awake and able to swallow liquids.  You should have someone stay with you for the next 24 hours.  Do not drive or operate machinery for the next 24 hours. This information is not intended to replace advice given to you by your health care provider. Make sure you discuss any questions you have with your health care provider. Document Released: 01/04/2009 Document Revised: 08/15/2015 Document Reviewed: 09/08/2012 Elsevier Interactive Patient Education  2018 [employer].      Hospital doctor cardioversion  is the delivery of a jolt of electricity to restore a normal rhythm to the heart. A rhythm that is too fast or is not regular keeps the heart from pumping well. In this procedure, sticky patches or metal paddles are placed on the chest to deliver electricity to the heart from a device. This procedure may be done in an emergency if:  There is low or no blood pressure as a result of the heart rhythm.  Normal rhythm must be restored as fast as possible to protect the brain and heart from further damage.  It may save a life.  This procedure may also be done for irregular or fast heart rhythms that are not immediately life-threatening. Tell a health care provider about:  Any allergies you have.  All medicines you are taking, including vitamins, herbs, eye drops, creams, and over-the-counter medicines.  Any problems you  or family members have had with anesthetic medicines.  Any blood disorders you have.  Any surgeries you have had.  Any medical conditions you have.  Whether you are pregnant or may be pregnant. What are the risks? Generally, this is a safe procedure. However, problems may occur, including:  Allergic reactions to medicines.  A blood clot that breaks free and travels to other parts of your body.  The possible return of an abnormal heart rhythm within hours or days after the procedure.  Your heart stopping (cardiac arrest). This is rare.  What happens before the procedure? Medicines  Your health care provider may have you start taking: ? Blood-thinning medicines (anticoagulants) so your blood does not clot as easily. ? Medicines may be given to help stabilize your heart rate and rhythm.  Ask your health care provider about changing or stopping your regular medicines. This is especially important if you are taking diabetes medicines or blood thinners. General instructions  Plan to have someone take you home from the hospital or clinic.  If you will be going home  right after the procedure, plan to have someone with you for 24 hours.  Follow instructions from your health care provider about eating or drinking restrictions. What happens during the procedure?  To lower your risk of infection: ? Your health care team will wash or sanitize their hands. ? Your skin will be washed with soap.  An IV tube will be inserted into one of your veins.  You will be given a medicine to help you relax (sedative).  Sticky patches (electrodes) or metal paddles may be placed on your chest.  An electrical shock will be delivered. The procedure may vary among health care providers and hospitals. What happens after the procedure?  Your blood pressure, heart rate, breathing rate, and blood oxygen level will be monitored until the medicines you were given have worn off.  Do not drive for 24 hours if you were given a sedative.  Your heart rhythm will be watched to make sure it does not change. This information is not intended to replace advice given to you by your health care provider. Make sure you discuss any questions you have with your health care provider. Document Released: 02/27/2002 Document Revised: 11/06/2015 Document Reviewed: 09/13/2015 Elsevier Interactive Patient Education  2017 [employer].

## 2018-02-11 ENCOUNTER — Telehealth: Payer: Self-pay | Admitting: *Deleted

## 2018-02-11 DIAGNOSIS — R7989 Other specified abnormal findings of blood chemistry: Secondary | ICD-10-CM

## 2018-02-11 DIAGNOSIS — Z01810 Encounter for preprocedural cardiovascular examination: Secondary | ICD-10-CM

## 2018-02-11 LAB — CBC WITH DIFFERENTIAL/PLATELET
Basophils Absolute: 0 10*3/uL (ref 0.0–0.2)
Basos: 0 %
EOS (ABSOLUTE): 0.2 10*3/uL (ref 0.0–0.4)
Eos: 1 %
Hematocrit: 34.5 % (ref 34.0–46.6)
Hemoglobin: 11.7 g/dL (ref 11.1–15.9)
IMMATURE GRANS (ABS): 0.1 10*3/uL (ref 0.0–0.1)
Immature Granulocytes: 1 %
LYMPHS: 5 %
Lymphocytes Absolute: 0.9 10*3/uL (ref 0.7–3.1)
MCH: 30.2 pg (ref 26.6–33.0)
MCHC: 33.9 g/dL (ref 31.5–35.7)
MCV: 89 fL (ref 79–97)
MONOS ABS: 0.9 10*3/uL (ref 0.1–0.9)
Monocytes: 5 %
Neutrophils Absolute: 16.2 10*3/uL — ABNORMAL HIGH (ref 1.4–7.0)
Neutrophils: 88 %
PLATELETS: 220 10*3/uL (ref 150–450)
RBC: 3.88 x10E6/uL (ref 3.77–5.28)
RDW: 13.8 % (ref 12.3–15.4)
WBC: 18.4 10*3/uL — AB (ref 3.4–10.8)

## 2018-02-11 LAB — MAGNESIUM: MAGNESIUM: 1.8 mg/dL (ref 1.6–2.3)

## 2018-02-11 LAB — COMPREHENSIVE METABOLIC PANEL
ALBUMIN: 2.9 g/dL — AB (ref 3.6–4.8)
ALK PHOS: 104 IU/L (ref 39–117)
ALT: 11 IU/L (ref 0–32)
AST: 20 IU/L (ref 0–40)
Albumin/Globulin Ratio: 0.9 — ABNORMAL LOW (ref 1.2–2.2)
BILIRUBIN TOTAL: 2 mg/dL — AB (ref 0.0–1.2)
BUN/Creatinine Ratio: 13 (ref 12–28)
BUN: 47 mg/dL — ABNORMAL HIGH (ref 8–27)
CHLORIDE: 85 mmol/L — AB (ref 96–106)
CO2: 21 mmol/L (ref 20–29)
CREATININE: 3.72 mg/dL — AB (ref 0.57–1.00)
Calcium: 8.8 mg/dL (ref 8.7–10.3)
GFR calc Af Amer: 14 mL/min/{1.73_m2} — ABNORMAL LOW (ref >59)
GFR calc non Af Amer: 12 mL/min/{1.73_m2} — ABNORMAL LOW (ref >59)
Globulin, Total: 3.3 g/dL (ref 1.5–4.5)
Glucose: 166 mg/dL — ABNORMAL HIGH (ref 65–99)
POTASSIUM: 2.9 mmol/L — AB (ref 3.5–5.2)
Sodium: 132 mmol/L — ABNORMAL LOW (ref 134–144)
Total Protein: 6.2 g/dL (ref 6.0–8.5)

## 2018-02-11 LAB — BASIC METABOLIC PANEL
BUN/Creatinine Ratio: 14 (ref 12–28)
BUN: 49 mg/dL — ABNORMAL HIGH (ref 8–27)
CHLORIDE: 83 mmol/L — AB (ref 96–106)
CO2: 21 mmol/L (ref 20–29)
Calcium: 8.5 mg/dL — ABNORMAL LOW (ref 8.7–10.3)
Creatinine, Ser: 3.46 mg/dL — ABNORMAL HIGH (ref 0.57–1.00)
GFR calc Af Amer: 15 mL/min/{1.73_m2} — ABNORMAL LOW (ref >59)
GFR calc non Af Amer: 13 mL/min/{1.73_m2} — ABNORMAL LOW (ref >59)
GLUCOSE: 134 mg/dL — AB (ref 65–99)
POTASSIUM: 3 mmol/L — AB (ref 3.5–5.2)
Sodium: 131 mmol/L — ABNORMAL LOW (ref 134–144)

## 2018-02-11 LAB — BRAIN NATRIURETIC PEPTIDE: BNP: 2052.4 pg/mL — ABNORMAL HIGH (ref 0.0–100.0)

## 2018-02-11 NOTE — Telephone Encounter (Signed)
Results called to pt. Pt verbalized understanding of results and instructions. She had just talked to Dr Caryn Section. Clarified with Ignacia Bayley, NP to have patient take potassium 60 mEq today, then 10 mEq (1/2 tablet) daily over the weekend.  Patient and husband verbalized understanding to: 1- take potassium 60 mEq today, then 10 mEq (1/2 tablet) daily over the weekend. 2- do not take torsemide today and over the weekend until advised after the procedure. 3- to arrive at 06:00 am on Monday prior to the procedure.  Bmet ordered entered as STAT.

## 2018-02-11 NOTE — Telephone Encounter (Signed)
-----   Message from Theora Gianotti, NP sent at 02/11/2018  8:20 AM EST ----- Kidney fxn markedly abnl in the setting of significant diuresis.  She will need to hold torsemide this weekend and increase oral hydration.  She has KCl 20 meq @ home and has been taking daily.  I'd like her to take 3 tabs today.  She is scheduled for TEE/DCCV on Monday morning.  She will need STAT BMET that morning prior to procedure.  If over the weekend, she notes worsening symptoms, she will need to present to the ED for admission and further eval.

## 2018-02-14 ENCOUNTER — Ambulatory Visit: Admission: RE | Admit: 2018-02-14 | Payer: Medicare Other | Source: Ambulatory Visit | Admitting: Cardiovascular Disease

## 2018-02-14 ENCOUNTER — Encounter: Admission: RE | Disposition: E | Payer: Self-pay | Source: Ambulatory Visit | Attending: Internal Medicine

## 2018-02-14 ENCOUNTER — Inpatient Hospital Stay: Payer: Medicare Other

## 2018-02-14 ENCOUNTER — Ambulatory Visit: Payer: Medicare Other | Admitting: Anesthesiology

## 2018-02-14 ENCOUNTER — Inpatient Hospital Stay
Admission: RE | Admit: 2018-02-14 | Discharge: 2018-02-20 | DRG: 682 | Disposition: E | Payer: Medicare Other | Source: Ambulatory Visit | Attending: Internal Medicine | Admitting: Internal Medicine

## 2018-02-14 ENCOUNTER — Other Ambulatory Visit: Payer: Self-pay

## 2018-02-14 DIAGNOSIS — J690 Pneumonitis due to inhalation of food and vomit: Secondary | ICD-10-CM | POA: Diagnosis not present

## 2018-02-14 DIAGNOSIS — K219 Gastro-esophageal reflux disease without esophagitis: Secondary | ICD-10-CM | POA: Diagnosis present

## 2018-02-14 DIAGNOSIS — Z8673 Personal history of transient ischemic attack (TIA), and cerebral infarction without residual deficits: Secondary | ICD-10-CM | POA: Diagnosis not present

## 2018-02-14 DIAGNOSIS — R109 Unspecified abdominal pain: Secondary | ICD-10-CM

## 2018-02-14 DIAGNOSIS — E785 Hyperlipidemia, unspecified: Secondary | ICD-10-CM | POA: Diagnosis present

## 2018-02-14 DIAGNOSIS — M109 Gout, unspecified: Secondary | ICD-10-CM | POA: Diagnosis present

## 2018-02-14 DIAGNOSIS — Z6841 Body Mass Index (BMI) 40.0 and over, adult: Secondary | ICD-10-CM

## 2018-02-14 DIAGNOSIS — Z7901 Long term (current) use of anticoagulants: Secondary | ICD-10-CM

## 2018-02-14 DIAGNOSIS — E875 Hyperkalemia: Secondary | ICD-10-CM | POA: Diagnosis not present

## 2018-02-14 DIAGNOSIS — Z452 Encounter for adjustment and management of vascular access device: Secondary | ICD-10-CM

## 2018-02-14 DIAGNOSIS — A419 Sepsis, unspecified organism: Secondary | ICD-10-CM | POA: Diagnosis present

## 2018-02-14 DIAGNOSIS — I2510 Atherosclerotic heart disease of native coronary artery without angina pectoris: Secondary | ICD-10-CM | POA: Diagnosis present

## 2018-02-14 DIAGNOSIS — R6521 Severe sepsis with septic shock: Secondary | ICD-10-CM | POA: Diagnosis not present

## 2018-02-14 DIAGNOSIS — I5042 Chronic combined systolic (congestive) and diastolic (congestive) heart failure: Secondary | ICD-10-CM | POA: Diagnosis present

## 2018-02-14 DIAGNOSIS — N170 Acute kidney failure with tubular necrosis: Principal | ICD-10-CM | POA: Diagnosis present

## 2018-02-14 DIAGNOSIS — K920 Hematemesis: Secondary | ICD-10-CM | POA: Diagnosis not present

## 2018-02-14 DIAGNOSIS — E872 Acidosis: Secondary | ICD-10-CM | POA: Diagnosis present

## 2018-02-14 DIAGNOSIS — I4819 Other persistent atrial fibrillation: Secondary | ICD-10-CM | POA: Diagnosis present

## 2018-02-14 DIAGNOSIS — I252 Old myocardial infarction: Secondary | ICD-10-CM

## 2018-02-14 DIAGNOSIS — D62 Acute posthemorrhagic anemia: Secondary | ICD-10-CM | POA: Diagnosis not present

## 2018-02-14 DIAGNOSIS — K7201 Acute and subacute hepatic failure with coma: Secondary | ICD-10-CM | POA: Diagnosis not present

## 2018-02-14 DIAGNOSIS — Z01818 Encounter for other preprocedural examination: Secondary | ICD-10-CM

## 2018-02-14 DIAGNOSIS — Z7984 Long term (current) use of oral hypoglycemic drugs: Secondary | ICD-10-CM

## 2018-02-14 DIAGNOSIS — I4891 Unspecified atrial fibrillation: Secondary | ICD-10-CM | POA: Diagnosis not present

## 2018-02-14 DIAGNOSIS — I482 Chronic atrial fibrillation, unspecified: Secondary | ICD-10-CM | POA: Diagnosis not present

## 2018-02-14 DIAGNOSIS — J9601 Acute respiratory failure with hypoxia: Secondary | ICD-10-CM | POA: Diagnosis not present

## 2018-02-14 DIAGNOSIS — I5031 Acute diastolic (congestive) heart failure: Secondary | ICD-10-CM | POA: Diagnosis not present

## 2018-02-14 DIAGNOSIS — E119 Type 2 diabetes mellitus without complications: Secondary | ICD-10-CM | POA: Diagnosis not present

## 2018-02-14 DIAGNOSIS — Z515 Encounter for palliative care: Secondary | ICD-10-CM | POA: Diagnosis not present

## 2018-02-14 DIAGNOSIS — Z66 Do not resuscitate: Secondary | ICD-10-CM | POA: Diagnosis not present

## 2018-02-14 DIAGNOSIS — T502X5A Adverse effect of carbonic-anhydrase inhibitors, benzothiadiazides and other diuretics, initial encounter: Secondary | ICD-10-CM | POA: Diagnosis present

## 2018-02-14 DIAGNOSIS — Z4659 Encounter for fitting and adjustment of other gastrointestinal appliance and device: Secondary | ICD-10-CM

## 2018-02-14 DIAGNOSIS — N179 Acute kidney failure, unspecified: Secondary | ICD-10-CM | POA: Diagnosis present

## 2018-02-14 DIAGNOSIS — R570 Cardiogenic shock: Secondary | ICD-10-CM | POA: Diagnosis not present

## 2018-02-14 DIAGNOSIS — I509 Heart failure, unspecified: Secondary | ICD-10-CM

## 2018-02-14 DIAGNOSIS — G9341 Metabolic encephalopathy: Secondary | ICD-10-CM | POA: Diagnosis not present

## 2018-02-14 DIAGNOSIS — R791 Abnormal coagulation profile: Secondary | ICD-10-CM | POA: Diagnosis present

## 2018-02-14 DIAGNOSIS — D65 Disseminated intravascular coagulation [defibrination syndrome]: Secondary | ICD-10-CM | POA: Diagnosis present

## 2018-02-14 DIAGNOSIS — I700 Atherosclerosis of aorta: Secondary | ICD-10-CM | POA: Diagnosis present

## 2018-02-14 DIAGNOSIS — R34 Anuria and oliguria: Secondary | ICD-10-CM | POA: Diagnosis present

## 2018-02-14 DIAGNOSIS — I110 Hypertensive heart disease with heart failure: Secondary | ICD-10-CM | POA: Diagnosis present

## 2018-02-14 DIAGNOSIS — T45515A Adverse effect of anticoagulants, initial encounter: Secondary | ICD-10-CM | POA: Diagnosis present

## 2018-02-14 DIAGNOSIS — R05 Cough: Secondary | ICD-10-CM

## 2018-02-14 DIAGNOSIS — F419 Anxiety disorder, unspecified: Secondary | ICD-10-CM | POA: Diagnosis present

## 2018-02-14 DIAGNOSIS — J9600 Acute respiratory failure, unspecified whether with hypoxia or hypercapnia: Secondary | ICD-10-CM

## 2018-02-14 DIAGNOSIS — Z955 Presence of coronary angioplasty implant and graft: Secondary | ICD-10-CM

## 2018-02-14 DIAGNOSIS — Z8249 Family history of ischemic heart disease and other diseases of the circulatory system: Secondary | ICD-10-CM

## 2018-02-14 DIAGNOSIS — I340 Nonrheumatic mitral (valve) insufficiency: Secondary | ICD-10-CM | POA: Diagnosis present

## 2018-02-14 DIAGNOSIS — Z87891 Personal history of nicotine dependence: Secondary | ICD-10-CM

## 2018-02-14 DIAGNOSIS — J449 Chronic obstructive pulmonary disease, unspecified: Secondary | ICD-10-CM | POA: Diagnosis present

## 2018-02-14 DIAGNOSIS — R059 Cough, unspecified: Secondary | ICD-10-CM

## 2018-02-14 DIAGNOSIS — R918 Other nonspecific abnormal finding of lung field: Secondary | ICD-10-CM | POA: Diagnosis present

## 2018-02-14 DIAGNOSIS — I2582 Chronic total occlusion of coronary artery: Secondary | ICD-10-CM | POA: Diagnosis present

## 2018-02-14 DIAGNOSIS — J9811 Atelectasis: Secondary | ICD-10-CM

## 2018-02-14 HISTORY — PX: CARDIOVERSION: EP1203

## 2018-02-14 HISTORY — PX: TEE WITHOUT CARDIOVERSION: SHX5443

## 2018-02-14 LAB — URINALYSIS, ROUTINE W REFLEX MICROSCOPIC
BILIRUBIN URINE: NEGATIVE
Glucose, UA: NEGATIVE mg/dL
Hgb urine dipstick: NEGATIVE
Ketones, ur: NEGATIVE mg/dL
Nitrite: NEGATIVE
PROTEIN: NEGATIVE mg/dL
SPECIFIC GRAVITY, URINE: 1.013 (ref 1.005–1.030)
pH: 5 (ref 5.0–8.0)

## 2018-02-14 LAB — CBC
HEMATOCRIT: 38.9 % (ref 36.0–46.0)
Hemoglobin: 12.2 g/dL (ref 12.0–15.0)
MCH: 29.3 pg (ref 26.0–34.0)
MCHC: 31.4 g/dL (ref 30.0–36.0)
MCV: 93.3 fL (ref 80.0–100.0)
Platelets: 247 10*3/uL (ref 150–400)
RBC: 4.17 MIL/uL (ref 3.87–5.11)
RDW: 14.8 % (ref 11.5–15.5)
WBC: 18.1 10*3/uL — AB (ref 4.0–10.5)
nRBC: 0 % (ref 0.0–0.2)

## 2018-02-14 LAB — HEMOGLOBIN A1C
HEMOGLOBIN A1C: 7.8 % — AB (ref 4.8–5.6)
MEAN PLASMA GLUCOSE: 177.16 mg/dL

## 2018-02-14 LAB — BASIC METABOLIC PANEL
Anion gap: 23 — ABNORMAL HIGH (ref 5–15)
BUN: 101 mg/dL — AB (ref 8–23)
CO2: 20 mmol/L — ABNORMAL LOW (ref 22–32)
CREATININE: 6.87 mg/dL — AB (ref 0.44–1.00)
Calcium: 8.8 mg/dL — ABNORMAL LOW (ref 8.9–10.3)
Chloride: 87 mmol/L — ABNORMAL LOW (ref 98–111)
GFR calc Af Amer: 6 mL/min — ABNORMAL LOW (ref >60)
GFR, EST NON AFRICAN AMERICAN: 6 mL/min — AB (ref >60)
Glucose, Bld: 183 mg/dL — ABNORMAL HIGH (ref 70–99)
POTASSIUM: 5 mmol/L (ref 3.5–5.1)
SODIUM: 130 mmol/L — AB (ref 135–145)

## 2018-02-14 LAB — PROTEIN / CREATININE RATIO, URINE
CREATININE, URINE: 158 mg/dL
Protein Creatinine Ratio: 0.26 mg/mg{Cre} — ABNORMAL HIGH (ref 0.00–0.15)
TOTAL PROTEIN, URINE: 41 mg/dL

## 2018-02-14 LAB — GLUCOSE, CAPILLARY
GLUCOSE-CAPILLARY: 143 mg/dL — AB (ref 70–99)
Glucose-Capillary: 168 mg/dL — ABNORMAL HIGH (ref 70–99)
Glucose-Capillary: 131 mg/dL — ABNORMAL HIGH (ref 70–99)
Glucose-Capillary: 107 mg/dL — ABNORMAL HIGH (ref 70–99)

## 2018-02-14 LAB — APTT: APTT: 48 s — AB (ref 24–36)

## 2018-02-14 LAB — PROTIME-INR
INR: 8.05
Prothrombin Time: 66 seconds — ABNORMAL HIGH (ref 11.4–15.2)

## 2018-02-14 LAB — MRSA PCR SCREENING: MRSA BY PCR: NEGATIVE

## 2018-02-14 LAB — HEPARIN LEVEL (UNFRACTIONATED): Heparin Unfractionated: >3.6 IU/mL — ABNORMAL HIGH (ref 0.30–0.70)

## 2018-02-14 SURGERY — ECHOCARDIOGRAM, TRANSESOPHAGEAL
Anesthesia: General

## 2018-02-14 MED ORDER — ACETAMINOPHEN 325 MG PO TABS
650.0000 mg | ORAL_TABLET | Freq: Four times a day (QID) | ORAL | Status: DC | PRN
  Administered 2018-02-16: 650 mg via ORAL
  Filled 2018-02-14: qty 2

## 2018-02-14 MED ORDER — BISACODYL 5 MG PO TBEC
5.0000 mg | DELAYED_RELEASE_TABLET | Freq: Every day | ORAL | Status: DC | PRN
  Filled 2018-02-14: qty 1

## 2018-02-14 MED ORDER — SODIUM CHLORIDE 0.9 % IV SOLN
INTRAVENOUS | Status: DC
  Administered 2018-02-14: 08:00:00 via INTRAVENOUS

## 2018-02-14 MED ORDER — ONDANSETRON HCL 4 MG PO TABS
4.0000 mg | ORAL_TABLET | Freq: Four times a day (QID) | ORAL | Status: DC | PRN
  Administered 2018-02-17: 4 mg via ORAL
  Filled 2018-02-14: qty 1

## 2018-02-14 MED ORDER — DILTIAZEM HCL-DEXTROSE 100-5 MG/100ML-% IV SOLN (PREMIX)
5.0000 mg/h | INTRAVENOUS | Status: DC
  Administered 2018-02-14 (×2): 5 mg/h via INTRAVENOUS
  Filled 2018-02-14 (×2): qty 100

## 2018-02-14 MED ORDER — ONDANSETRON HCL 4 MG/2ML IJ SOLN
4.0000 mg | Freq: Four times a day (QID) | INTRAMUSCULAR | Status: DC | PRN
  Administered 2018-02-17: 4 mg via INTRAVENOUS
  Filled 2018-02-14: qty 2

## 2018-02-14 MED ORDER — ACETAMINOPHEN 650 MG RE SUPP
650.0000 mg | Freq: Four times a day (QID) | RECTAL | Status: DC | PRN
  Filled 2018-02-14: qty 1

## 2018-02-14 MED ORDER — SODIUM CHLORIDE 0.9 % IV SOLN
INTRAVENOUS | Status: DC
  Administered 2018-02-14 – 2018-02-15 (×3): via INTRAVENOUS

## 2018-02-14 MED ORDER — HEPARIN (PORCINE) 25000 UT/250ML-% IV SOLN: 1000.0000 [IU]/h | INTRAVENOUS | Status: DC

## 2018-02-14 MED ORDER — PROPOFOL 500 MG/50ML IV EMUL
INTRAVENOUS | Status: AC
  Filled 2018-02-14: qty 50

## 2018-02-14 MED ORDER — INSULIN ASPART 100 UNIT/ML ~~LOC~~ SOLN: 0.0000 [IU] | Freq: Every day | SUBCUTANEOUS | Status: DC

## 2018-02-14 MED ORDER — INSULIN ASPART 100 UNIT/ML ~~LOC~~ SOLN
0.0000 [IU] | Freq: Three times a day (TID) | SUBCUTANEOUS | Status: DC
  Administered 2018-02-16 (×3): 1 [IU] via SUBCUTANEOUS
  Administered 2018-02-17 (×3): 2 [IU] via SUBCUTANEOUS
  Administered 2018-02-18: 1 [IU] via SUBCUTANEOUS
  Filled 2018-02-14 (×7): qty 1

## 2018-02-14 MED ORDER — DOCUSATE SODIUM 100 MG PO CAPS
100.0000 mg | ORAL_CAPSULE | Freq: Two times a day (BID) | ORAL | Status: DC
  Administered 2018-02-17: 100 mg via ORAL
  Filled 2018-02-14 (×6): qty 1

## 2018-02-14 MED ORDER — ALPRAZOLAM 0.25 MG PO TABS: 0.2500 mg | ORAL_TABLET | Freq: Two times a day (BID) | ORAL | Status: DC | PRN

## 2018-02-14 MED ORDER — DILTIAZEM LOAD VIA INFUSION
10.0000 mg | Freq: Once | INTRAVENOUS | Status: AC
  Administered 2018-02-14: 10 mg via INTRAVENOUS
  Filled 2018-02-14: qty 10

## 2018-02-14 MED ORDER — IPRATROPIUM-ALBUTEROL 0.5-2.5 (3) MG/3ML IN SOLN: 3.0000 mL | RESPIRATORY_TRACT | Status: DC | PRN

## 2018-02-14 MED ORDER — HYDROCODONE-ACETAMINOPHEN 5-325 MG PO TABS
1.0000 | ORAL_TABLET | ORAL | Status: DC | PRN
  Administered 2018-02-16: 1 via ORAL
  Administered 2018-02-16 – 2018-02-17 (×2): 2 via ORAL
  Administered 2018-02-17 (×2): 1 via ORAL
  Filled 2018-02-14: qty 2
  Filled 2018-02-14: qty 1
  Filled 2018-02-14: qty 2
  Filled 2018-02-14 (×2): qty 1

## 2018-02-14 MED ORDER — TRAZODONE HCL 50 MG PO TABS
25.0000 mg | ORAL_TABLET | Freq: Every evening | ORAL | Status: DC | PRN
  Administered 2018-02-15 – 2018-02-17 (×2): 25 mg via ORAL
  Filled 2018-02-14: qty 1
  Filled 2018-02-14: qty 0.5
  Filled 2018-02-14: qty 1

## 2018-02-14 NOTE — Progress Notes (Signed)
Dr. Soyla Murphy gave order to give 10 mg loading bolus via infusion of cardizem.

## 2018-02-14 NOTE — Progress Notes (Signed)
ANTICOAGULATION CONSULT NOTE  Pharmacy Consult for heparin Indication: atrial fibrillation  No Known Allergies  Patient Measurements: Height: 5\' 4"  (162.6 cm) Weight: 238 lb 15.7 oz (108.4 kg) IBW/kg (Calculated) : 54.7 Heparin Dosing Weight: 77.8 kg  Vital Signs: Temp: 97.6 F (36.4 C) (11/25 1000) Temp Source: Oral (11/25 1000) BP: 140/69 (11/25 1530) Pulse Rate: 117 (11/25 1530)  Labs: Recent Labs    01/28/2018 0717 01/26/2018 1022  HGB  --  12.2  HCT  --  38.9  PLT  --  247  APTT  --  48*  LABPROT  --  66.0*  INR  --  8.05*  HEPARINUNFRC  --  >3.60*  CREATININE 6.87*  --     Estimated Creatinine Clearance: 9.4 mL/min (A) (by C-G formula based on SCr of 6.87 mg/dL (H)).   Medical History: Past Medical History:  Diagnosis Date  . Anemia   . Anxiety   . Cerebral infarction ([place]) 2006  . Chronic combined systolic (congestive) and diastolic (congestive) heart failure ([place])    a. 08/2005 MV: EF 42%; b. 01/2018 Echo: EF 45-50%. Mild to mod MR.  Marland Kitchen COPD (chronic obstructive pulmonary disease) ([place])   . Coronary artery disease 2006   a. 01/2005 PCI: DES->OM. RCA 100p. Complicated by groin PSA; b 08/2005 MV: EF 42%, anterior and high lateral scar w/ peri-inf ischemia.  . Essential hypertension   . GERD (gastroesophageal reflux disease)   . MI (myocardial infarction) ([place]) 2006  . Persistent atrial fibrillation    a. Dx 01/2018. CHA2DS2VASc = 8-->Xarelto.  . Type II diabetes mellitus [facility])     Assessment: 67 year old female with afib on Xarelto PTA. Patient reports last dose 11/24 at approximately 1800. Xarelto to be held. Pharmacy has been consulted for heparin.  Goal of Therapy:  Heparin level 0.3-0.7 units/ml aPTT 66-102 seconds Monitor platelets by anticoagulation protocol: Yes   Plan:  Per conversation with cardiology, will defer heparin drip overnight in setting of renal failure and previous Xarelto doses.   Will reassess anticoagulation needs in am.    Simpson,Michael L,  01/26/2018 4:54 PM

## 2018-02-14 NOTE — Progress Notes (Signed)
ANTICOAGULATION CONSULT NOTE  Pharmacy Consult for heparin Indication: atrial fibrillation  No Known Allergies  Patient Measurements: Height: 5\' 4"  (162.6 cm) Weight: 220 lb (99.8 kg) IBW/kg (Calculated) : 54.7 Heparin Dosing Weight: 77.8 kg  Vital Signs: Temp: 97.5 F (36.4 C) (11/25 0708) Temp Source: Oral (11/25 0708) BP: 126/95 (11/25 0708) Pulse Rate: 134 (11/25 0800)  Labs: Recent Labs    02/16/2018 0717  CREATININE 6.87*    Estimated Creatinine Clearance: 9 mL/min (A) (by C-G formula based on SCr of 6.87 mg/dL (H)).   Medical History: Past Medical History:  Diagnosis Date  . Anemia   . Anxiety   . Cerebral infarction ([place]) 2006  . Chronic combined systolic (congestive) and diastolic (congestive) heart failure ([place])    a. 08/2005 MV: EF 42%; b. 01/2018 Echo: EF 45-50%. Mild to mod MR.  Marland Kitchen COPD (chronic obstructive pulmonary disease) ([place])   . Coronary artery disease 2006   a. 01/2005 PCI: DES->OM. RCA 100p. Complicated by groin PSA; b 08/2005 MV: EF 42%, anterior and high lateral scar w/ peri-inf ischemia.  . Essential hypertension   . GERD (gastroesophageal reflux disease)   . MI (myocardial infarction) ([employer]) 2006  . Persistent atrial fibrillation    a. Dx 01/2018. CHA2DS2VASc = 8-->Xarelto.  . Type II diabetes mellitus [facility])     Assessment: 67 year old female with afib on Xarelto PTA. Patient reports last dose 11/24 at approximately 1800. Xarelto to be held. Pharmacy has been consulted for heparin.  Goal of Therapy:  Heparin level 0.3-0.7 units/ml aPTT 66-102 seconds Monitor platelets by anticoagulation protocol: Yes   Plan:  Heparin to start today at 1800 which will be 24 hours after last dose of Xarelto. With significant decrease in renal function, will not give bolus. Start heparin drip at 1000 units/hr tonight at 1800 and check APTT 8 hours after start of infusion. CBC and HL with morning labs.  Tawnya Crook, PharmD Pharmacy Resident   02/17/2018 9:08 AM

## 2018-02-14 NOTE — Progress Notes (Signed)
Dr. Soyla Murphy present and RN made MD aware of critical PT of 101 and critical INR of 8.05.  MD acknowledged and gave no new orders.

## 2018-02-14 NOTE — Consult Note (Signed)
Name: Crystal Pacheco MRN: [id number] DOB: 1950-09-18     CONSULTATION DATE: 01/21/2018    HISTORY OF PRESENT ILLNESS:   67 years old lady with history of morbid obesity, atrial fibrillation on Xarelto for anticoagulation, coronary artery disease, congestive heart failure, hypertension, dyslipidemia, COPD, diabetes mellitus and CVA.  Patient was scheduled to have TEE/DCCV for atrial fibrillation earlier today.  The procedure has to be put on hold because of acute kidney injury with creatinine up to 6.8.  She was started on Cardizem drip and admitted to the stepdown awaiting starting heparin drip as per cardiology.  All history was obtained from admitting physician, the patient and EMR. Patient is currently in the intensive care unit in no distress, no chest pain and on Cardizem drip. PAST MEDICAL HISTORY :   has a past medical history of Anemia, Anxiety, Cerebral infarction ([place]) (2006), Chronic combined systolic (congestive) and diastolic (congestive) heart failure ([place]), COPD (chronic obstructive pulmonary disease) ([place]), Coronary artery disease (2006), Essential hypertension, GERD (gastroesophageal reflux disease), MI (myocardial infarction) ([place]) (2006), Persistent atrial fibrillation, and Type II diabetes mellitus ([place]).  has a past surgical history that includes NM MYOVIEW LTD; Cardiac catheterization; Coronary angioplasty with stent (02/10/2005); and Carotid surgery stent. Prior to Admission medications   Medication Sig Start Date End Date Taking? Authorizing Provider  allopurinol (ZYLOPRIM) 100 MG tablet Take 1 tablet (100 mg total) by mouth daily. 05/20/17  Yes Birdie Sons, MD  colchicine 0.6 MG tablet Take 2 tablets today, then one daily until gout is gone Patient taking differently: Take 0.6 mg by mouth daily as needed (for gout flares).  05/19/17  Yes Birdie Sons, MD  diphenhydrAMINE (BENADRYL) 25 mg capsule Take 25 mg by mouth 2 (two) times daily.   Yes [provider]  metFORMIN (GLUCOPHAGE-XR) 500 MG 24 hr tablet TAKE 1 TABLET BY MOUTH EVERY DAY WITH BREAKFAST Patient taking differently: Take 500 mg by mouth 3 (three) times a week. TAKE 1 TABLET BY MOUTH EVERY DAY WITH BREAKFAST 05/19/17  Yes Birdie Sons, MD  metoprolol tartrate (LOPRESSOR) 25 MG tablet Take 2 tablets (50 mg total) by mouth 2 (two) times daily. Patient taking differently: Take 50 mg by mouth daily.  01/31/18  Yes Fisher, Kirstie Peri, MD  NITROSTAT 0.4 MG SL tablet DISSOLVE 1 TABLET UNDER TONGUE EVERY 3 TO 5 MINUTES AS NEEDED FOR CHEST PAIN 11/12/14  Yes Birdie Sons, MD  omeprazole (PRILOSEC) 20 MG capsule TAKE 1 CAPSULE BY MOUTH TWO TIMES DAILY Patient taking differently: Take 20 mg by mouth 2 (two) times daily as needed (for acid reflux/indigestion.).  05/21/17  Yes Birdie Sons, MD  potassium chloride SA (K-DUR,KLOR-CON) 20 MEQ tablet Take 1 tablet (20 mEq total) by mouth daily. 02/03/18  Yes Birdie Sons, MD  rivaroxaban (XARELTO) 20 MG TABS tablet Take 1 tablet (20 mg total) by mouth daily with supper. 02/10/18  Yes Birdie Sons, MD  torsemide (DEMADEX) 100 MG tablet Take 1 tablet (100 mg total) by mouth daily. 01/31/18  Yes Birdie Sons, MD  ALPRAZolam Duanne Moron) 0.25 MG tablet TAKE 1/2-1 TABLET BY MOUTH TWICE DAILY AS NEEDED Patient not taking: Reported on 01/24/2018 05/21/17   Birdie Sons, MD  atorvastatin (LIPITOR) 80 MG tablet TAKE 1 TABLET EVERY DAY Patient not taking: Reported on 02/13/2018 05/20/17   Birdie Sons, MD  glucose blood test strip Use as instructed 11/14/14   Birdie Sons, MD   No  Known Allergies  FAMILY HISTORY:  family history includes CAD in her mother. SOCIAL HISTORY:  reports that she quit smoking about 10 years ago. Her smoking use included cigarettes. She has a 20.00 pack-year smoking history. She has never used smokeless tobacco. She reports that she drinks about 2.0 standard drinks of alcohol per week. She reports that she does  not use drugs.  REVIEW OF SYSTEMS:   Unable to obtain due to critical illness   VITAL SIGNS: Temp:  [97.5 F (36.4 C)-97.6 F (36.4 C)] 97.6 F (36.4 C) (11/25 1000) Pulse Rate:  [97-143] 106 (11/25 1200) Resp:  [18-27] 24 (11/25 1200) BP: (101-126)/(60-95) 114/85 (11/25 1200) SpO2:  [91 %-99 %] 95 % (11/25 1200) Weight:  [99.8 kg-108.4 kg] 108.4 kg (11/25 1000)  Physical Examination:  Awake and oriented with no focal neurological deficits Tolerating room air, no distress, able to talk in full sentences, bilateral equal air entry with no adventitious sounds S1 & S2 are audible with no murmur Obese abdomen with normal peristalsis Bilateral leg edema 2+   ASSESSMENT / PLAN:  A. fib with RVR.  Echo 02/07/2018 LVEF 45 to 50%, on Cardizem drip and started on heparin drip and Xarelto is on hold because of AKI .  Patient was scheduled to have TEE/DCCV earlier today and has set on hold because of serum creatinine 6.87 GFR 6.  As per cardiology -Optimize rate control -Management as per cardiology  AKI -Optimize hydration, avoid nephrotoxins, monitor renal panel and urine output -Renal ultrasound and follow with renal consult  CHF with bilateral leg edema.  Abdomen LVEF 45 to 50% -Achieve negative fluid balance as per nephrology considering worsening renal function  COPD -Bronchodilators  Coronary artery disease, hypertension  Diabetes mellitus -Glycemic control  Dyslipidemia -Statins  Gouty arthritis and limited physical capacity being only able to go to the bathroom -Allopurinol and optimize analgesia   Coagulopathy due to Xarelto -Monitor cognition provide  Full code  Supportive care  DVT & GI prophylaxis.  Continue with supportive care Plan of care was discussed with the patient and she agreed to  Critical care time 45 minutes

## 2018-02-14 NOTE — Anesthesia Preprocedure Evaluation (Addendum)
Anesthesia Evaluation  Patient identified by MRN, date of birth, ID band Patient awake    Reviewed: Allergy & Precautions, H&P , NPO status , Patient's Chart, lab work & pertinent test results  Airway Mallampati: III       Dental  (+) Poor Dentition, Chipped   Pulmonary shortness of breath, COPD, former smoker,           Cardiovascular hypertension, + CAD, + Past MI (pt denies every having an MI) and +CHF  + dysrhythmias Atrial Fibrillation   Echo 02/07/18: - Left ventricle: The cavity size was normal. Wall thickness was   normal. Systolic function was mildly reduced. The estimated   ejection fraction was in the range of 45% to 50%. - Aortic valve: Valve area (VTI): 0.98 cm^2. Valve area (Vmax):   1.54 cm^2. Valve area (Vmean): 1.37 cm^2. - Mitral valve: There was mild to moderate regurgitation.    Neuro/Psych PSYCHIATRIC DISORDERS Anxiety CVA (pt denies ever having a stroke)    GI/Hepatic Neg liver ROS, GERD  ,  Endo/Other  negative endocrine ROSdiabetes  Renal/GU CRFRenal disease  negative genitourinary   Musculoskeletal   Abdominal   Peds  Hematology  (+) Blood dyscrasia, anemia ,   Anesthesia Other Findings Past Medical History: No date: Anemia No date: Anxiety 2006: Cerebral infarction ([place]) No date: Chronic combined systolic (congestive) and diastolic  (congestive) heart failure ([place])     Comment:  a. 08/2005 MV: EF 42%; b. 01/2018 Echo: EF 45-50%. Mild               to mod MR. No date: COPD (chronic obstructive pulmonary disease) ([place]) 2006: Coronary artery disease     Comment:  a. 01/2005 PCI: DES->OM. RCA 100p. Complicated by groin               PSA; b 08/2005 MV: EF 42%, anterior and high lateral scar               w/ peri-inf ischemia. No date: Essential hypertension No date: GERD (gastroesophageal reflux disease) 2006: MI (myocardial infarction) ([place]) No date: Persistent atrial fibrillation  Comment:  a. Dx 01/2018. CHA2DS2VASc = 8-->Xarelto. No date: Type II diabetes mellitus ([place])  Past Surgical History: No date: CARDIAC CATHETERIZATION No date: Carotid surgery stent 02/10/2005: CORONARY ANGIOPLASTY WITH STENT PLACEMENT     Comment:  Paclitaxel Eluting Taxus Express 2 Monorail 2.75 x 93mm               left Cx-marg. No date: NM MYOVIEW LTD     Comment:  Myoview EF= 42% High lateral wall defect   BMI    Body Mass Index:  37.76 kg/m      Reproductive/Obstetrics negative OB ROS                            Anesthesia Physical Anesthesia Plan  ASA: III  Anesthesia Plan: General   Post-op Pain Management:    Induction:   PONV Risk Score and Plan:   Airway Management Planned: Natural Airway and Nasal Cannula  Additional Equipment:   Intra-op Plan:   Post-operative Plan:   Informed Consent: I have reviewed the patients History and Physical, chart, labs and discussed the procedure including the risks, benefits and alternatives for the proposed anesthesia with the patient or authorized representative who has indicated his/her understanding and acceptance.   Dental Advisory Given  Plan Discussed with: Anesthesiologist, CRNA and Surgeon  Anesthesia Plan  Comments:         Anesthesia Quick Evaluation

## 2018-02-14 NOTE — Consult Note (Signed)
Date: 01/29/2018                  Patient Name:  Crystal Pacheco  MRN: [id number]  DOB: 06-01-1950  Age / Sex: 67 y.o., female         PCP: Birdie Sons, MD                 Service Requesting Consult:  Internal medicine/ Max Sane, MD                 Reason for Consult:  Acute renal failure            History of Present Illness: Patient is a 67 y.o. female with medical problems of chronic combined systolic and diastolic CHF, COPD, coronary disease with history of angioplasty, hypertension, atrial fibrillation, diabetes type 2, non-insulin-dependent, who was admitted to [facility] on 01/22/2018  Recently, patient reported to her PMD that she had increased edema and fluid retention.  She was placed on high-dose torsemide which resulted in brisk diuresis.  In order to facilitate that, lisinopril was stopped and metoprolol and Xarelto was started Patient was scheduled to have a TEE and cardioversion but was noted to have significantly elevated BUN and creatinine.  She was therefore referred for admission.  Currently she is being monitored in the ICU and nephrology consult has been requested for evaluation Serum creatinine trends are summarized below  Results for Crystal Pacheco (MRN [id number]) as of 02/18/2018 17:39  Ref. Range 01/31/2018 16:28 02/10/2018 12:18 02/10/2018 15:20 02/18/2018 07:17  BUN Latest Ref Range: 8 - 23 mg/dL 22 47 (H) 49 (H) 101 (H)  Creatinine Latest Ref Range: 0.44 - 1.00 mg/dL 0.97 3.72 (H) 3.46 (H) 6.87 (H)    Medications: Outpatient medications: Medications Prior to Admission  Medication Sig Dispense Refill Last Dose  . allopurinol (ZYLOPRIM) 100 MG tablet Take 1 tablet (100 mg total) by mouth daily. 90 tablet 4 02/13/2018 at Unknown time  . colchicine 0.6 MG tablet Take 2 tablets today, then one daily until gout is gone (Patient taking differently: Take 0.6 mg by mouth daily as needed (for gout flares). ) 30 tablet 1 Taking  . diphenhydrAMINE (BENADRYL) 25  mg capsule Take 25 mg by mouth 2 (two) times daily.   02/05/2018 at Unknown time  . metFORMIN (GLUCOPHAGE-XR) 500 MG 24 hr tablet TAKE 1 TABLET BY MOUTH EVERY DAY WITH BREAKFAST (Patient taking differently: Take 500 mg by mouth 3 (three) times a week. TAKE 1 TABLET BY MOUTH EVERY DAY WITH BREAKFAST) 90 tablet 3 Taking  . metoprolol tartrate (LOPRESSOR) 25 MG tablet Take 2 tablets (50 mg total) by mouth 2 (two) times daily. (Patient taking differently: Take 50 mg by mouth daily. ) 3 tablet 3 01/24/2018 at Unknown time  . NITROSTAT 0.4 MG SL tablet DISSOLVE 1 TABLET UNDER TONGUE EVERY 3 TO 5 MINUTES AS NEEDED FOR CHEST PAIN 25 tablet 0 Taking  . omeprazole (PRILOSEC) 20 MG capsule TAKE 1 CAPSULE BY MOUTH TWO TIMES DAILY (Patient taking differently: Take 20 mg by mouth 2 (two) times daily as needed (for acid reflux/indigestion.). ) 180 capsule 1 Taking  . potassium chloride SA (K-DUR,KLOR-CON) 20 MEQ tablet Take 1 tablet (20 mEq total) by mouth daily. 30 tablet 1 02/03/2018 at Unknown time  . rivaroxaban (XARELTO) 20 MG TABS tablet Take 1 tablet (20 mg total) by mouth daily with supper. 30 tablet 3 02/13/2018 at Unknown time  . torsemide (DEMADEX) 100 MG tablet Take  1 tablet (100 mg total) by mouth daily. 30 tablet 0 Taking  . ALPRAZolam (XANAX) 0.25 MG tablet TAKE 1/2-1 TABLET BY MOUTH TWICE DAILY AS NEEDED (Patient not taking: Reported on 01/24/2018) 180 tablet 1 Not Taking at Unknown time  . atorvastatin (LIPITOR) 80 MG tablet TAKE 1 TABLET EVERY DAY (Patient not taking: Reported on 01/30/2018) 90 tablet 4 Not Taking at Unknown time  . glucose blood test strip Use as instructed 100 each 12 Taking    Current medications: Current Facility-Administered Medications  Medication Dose Route Frequency Provider Last Rate Last Dose  . 0.9 %  sodium chloride infusion   Intravenous Continuous Murlean Iba, MD 100 mL/hr at 02/03/2018 0953    . acetaminophen (TYLENOL) tablet 650 mg  650 mg Oral Q6H PRN Max Sane, MD       Or  . acetaminophen (TYLENOL) suppository 650 mg  650 mg Rectal Q6H PRN Max Sane, MD      . bisacodyl (DULCOLAX) EC tablet 5 mg  5 mg Oral Daily PRN Max Sane, MD      . diltiazem (CARDIZEM) 100 mg in dextrose 5% 194mL (1 mg/mL) infusion  5-15 mg/hr Intravenous Titrated Kathlyn Sacramento A, MD 5 mL/hr at 01/27/2018 1050 5 mg/hr at 02/09/2018 1050  . docusate sodium (COLACE) capsule 100 mg  100 mg Oral BID Max Sane, MD      . HYDROcodone-acetaminophen (NORCO/VICODIN) 5-325 MG per tablet 1-2 tablet  1-2 tablet Oral Q4H PRN Max Sane, MD      . ondansetron (ZOFRAN) tablet 4 mg  4 mg Oral Q6H PRN Max Sane, MD       Or  . ondansetron (ZOFRAN) injection 4 mg  4 mg Intravenous Q6H PRN Max Sane, MD      . traZODone (DESYREL) tablet 25 mg  25 mg Oral QHS PRN Max Sane, MD          Allergies: No Known Allergies    Past Medical History: Past Medical History:  Diagnosis Date  . Anemia   . Anxiety   . Cerebral infarction ([place]) 2006  . Chronic combined systolic (congestive) and diastolic (congestive) heart failure ([place])    a. 08/2005 MV: EF 42%; b. 01/2018 Echo: EF 45-50%. Mild to mod MR.  Marland Kitchen COPD (chronic obstructive pulmonary disease) ([place])   . Coronary artery disease 2006   a. 01/2005 PCI: DES->OM. RCA 100p. Complicated by groin PSA; b 08/2005 MV: EF 42%, anterior and high lateral scar w/ peri-inf ischemia.  . Essential hypertension   . GERD (gastroesophageal reflux disease)   . MI (myocardial infarction) ([place]) 2006  . Persistent atrial fibrillation    a. Dx 01/2018. CHA2DS2VASc = 8-->Xarelto.  . Type II diabetes mellitus ([place])      Past Surgical History: Past Surgical History:  Procedure Laterality Date  . CARDIAC CATHETERIZATION    . Carotid surgery stent    . CORONARY ANGIOPLASTY WITH STENT PLACEMENT  02/10/2005   Paclitaxel Eluting Taxus Express 2 Monorail 2.75 x 30mm left Cx-marg.  Marland Kitchen NM MYOVIEW LTD     Myoview EF= 42% High lateral wall defect       Family History: Family History  Problem Relation Age of Onset  . CAD Mother        CABG in her 31's     Social History: Social History   Socioeconomic History  . Marital status: Married    Spouse name: Not on file  . Number of children: 1  . Years of  education: Not on file  . Highest education level: Not on file  Occupational History  . Occupation: Retired  Scientific laboratory technician  . Financial resource strain: Not on file  . Food insecurity:    Worry: Not on file    Inability: Not on file  . Transportation needs:    Medical: Not on file    Non-medical: Not on file  Tobacco Use  . Smoking status: Former Smoker    Packs/day: 1.00    Years: 20.00    Pack years: 20.00    Types: Cigarettes    Last attempt to quit: 11/15/2007    Years since quitting: 10.2  . Smokeless tobacco: Never Used  Substance and Sexual Activity  . Alcohol use: Yes    Alcohol/week: 2.0 standard drinks    Types: 2 Standard drinks or equivalent per week    Comment: occasional cocktail.  . Drug use: No  . Sexual activity: Not on file  Lifestyle  . Physical activity:    Days per week: Not on file    Minutes per session: Not on file  . Stress: Not on file  Relationships  . Social connections:    Talks on phone: Not on file    Gets together: Not on file    Attends religious service: Not on file    Active member of club or organization: Not on file    Attends meetings of clubs or organizations: Not on file    Relationship status: Not on file  . Intimate partner violence:    Fear of current or ex partner: Not on file    Emotionally abused: Not on file    Physically abused: Not on file    Forced sexual activity: Not on file  Other Topics Concern  . Not on file  Social History Narrative   Lives in [place] with husband.  Does not routinely exercise.  Activity limited by progressive LE claudication.     Review of Systems: Gen: Denies any fevers or chills HEENT: Denies any vision or hearing  problems CV: Irregular heartbeat, palpitations Resp: Some difficulty with breathing GI: Appetite is poor GU : Decreased urine output recently MS: Patient fell down yesterday, had difficulty getting up Derm:  Denies any acute complaints Psych: No complaints Heme: No complaints Neuro: No complaints Endocrine.  Poorly controlled diabetes as outpatient  Vital Signs: Blood pressure 114/85, pulse (!) 106, temperature 97.6 F (36.4 C), temperature source Oral, resp. rate (!) 24, height 5\' 4"  (1.626 m), weight 108.4 kg, SpO2 95 %.  No intake or output data in the 24 hours ending 02/05/2018 1224  Weight trends: Filed Weights   01/21/2018 0708 02/03/2018 1000  Weight: 99.8 kg 108.4 kg    Physical Exam: General:  Morbidly obese lady, lying in the bed  HEENT  anicteric, moist oral mucous membranes  Neck:  Short, thick, supple  Lungs:  Limited exam, mild crackles  Heart::  Tachycardic, irregular, atrial fibrillation  Abdomen:  Soft, distended  Extremities:  2+ edema  Neurologic:  Alert, able to answer questions  Skin:  Dry skin             Lab results: Basic Metabolic Panel: Recent Labs  Lab 02/10/18 1218 02/10/18 1520 01/23/2018 0717  NA 132* 131* 130*  K 2.9* 3.0* 5.0  CL 85* 83* 87*  CO2 21 21 20*  GLUCOSE 166* 134* 183*  BUN 47* 49* 101*  CREATININE 3.72* 3.46* 6.87*  CALCIUM 8.8 8.5* 8.8*  MG 1.8  --   --  Liver Function Tests: Recent Labs  Lab 02/10/18 1218  AST 20  ALT 11  ALKPHOS 104  BILITOT 2.0*  PROT 6.2  ALBUMIN 2.9*   No results for input(s): LIPASE, AMYLASE in the last 168 hours. No results for input(s): AMMONIA in the last 168 hours.  CBC: Recent Labs  Lab 02/10/18 1520 02/17/2018 1022  WBC 18.4* 18.1*  NEUTROABS 16.2*  --   HGB 11.7 12.2  HCT 34.5 38.9  MCV 89 93.3  PLT 220 247    Cardiac Enzymes: No results for input(s): CKTOTAL, TROPONINI in the last 168 hours.  BNP: Invalid input(s): POCBNP  CBG: Recent Labs  Lab  01/23/2018 0714 02/04/2018 0944  GLUCAP 168* 143*    Microbiology: Recent Results (from the past 720 hour(s))  MRSA PCR Screening     Status: None   Collection Time: 01/27/2018 10:06 AM  Result Value Ref Range Status   MRSA by PCR NEGATIVE NEGATIVE Final    Comment:        The GeneXpert MRSA Assay (FDA approved for NASAL specimens only), is one component of a comprehensive MRSA colonization surveillance program. It is not intended to diagnose MRSA infection nor to guide or monitor treatment for MRSA infections. Performed at [facility], [address] [place], [place] [address]      Coagulation Studies: Recent Labs    02/18/2018 1022  LABPROT 66.0*  INR 8.05*    Urinalysis: No results for input(s): COLORURINE, LABSPEC, PHURINE, GLUCOSEU, HGBUR, BILIRUBINUR, KETONESUR, PROTEINUR, UROBILINOGEN, NITRITE, LEUKOCYTESUR in the last 72 hours.  Invalid input(s): APPERANCEUR      Imaging:  No results found.   Assessment & Plan: Pt is a 67 y.o. Caucasian  female with  medical problems of chronic systolic CHF, COPD, coronary disease with history of angioplasty, hypertension, atrial fibrillation, diabetes type 2, non-insulin-dependent, who was admitted to [facility] on 01/24/2018  2D echo November 18: LVEF 45 to 50%, moderate mitral regurgitation  1.  Acute kidney injury 2.  Generalized edema 3.  Atrial fibrillation  Acute kidney injury is likely secondary to overdiuresis causing prerenal azotemia/ATN  Plan: -Maintain hemodynamics to avoid hypotension -Hold further diuretic administration -Renal ultrasound -Careful volume replacement to avoid fluid overload  We will follow closely     LOS: 0 Montrel Donahoe 11/25/201912:24 PM  [place], [place]  Note: This note was prepared with Dragon dictation. Any transcription errors are unintentional

## 2018-02-14 NOTE — Progress Notes (Signed)
Dr. Candiss Norse gave order to in and out cath patient for urine labs if patient has not voided.

## 2018-02-14 NOTE — Progress Notes (Addendum)
Inpatient Diabetes Program Recommendations  AACE/ADA: New Consensus Statement on Inpatient Glycemic Control (2019)  Target Ranges:  Prepandial:   less than 140 mg/dL      Peak postprandial:   less than 180 mg/dL (1-2 hours)      Critically ill patients:  140 - 180 mg/dL   Results for Crystal Pacheco, Crystal Pacheco (MRN [id number]) as of 02/16/2018 12:00  Ref. Range 02/06/2018 07:14 01/25/2018 09:44  Glucose-Capillary Latest Ref Range: 70 - 99 mg/dL 168 (H) 143 (H)  Results for Crystal Pacheco, Crystal Pacheco (MRN [id number]) as of 01/31/2018 12:00  Ref. Range 05/19/2017 12:00 01/31/2018 16:28  Hemoglobin A1C Latest Ref Range: 4.8 - 5.6 % 6.3 9.4 (H)   Review of Glycemic Control  Diabetes history: DM2 Outpatient Diabetes medications: Metformin XR 500 mg 3 times weekly (prescribed 500 mg QAM) Current orders for Inpatient glycemic control: None  Inpatient Diabetes Program Recommendations:  Correction (SSI): Please consider ordering CBGs with Novolog 0-9 units TID with meals and Novolog 0-5 units QHS. HbgA1C: Noted A1C 9.4% on 01/31/18 indicating an average glucose of 223 mg/dl over the past 2-3 months. Patient reports that she does not take Metformin due to it making her nauseous. At time of discharge, recommend starting patient on a different DM medication due to patient not tolerating Metformin. Also, recommend patient follow up with PCP.  NOTE: In reviewing chart, noted patient seen PCP on 02/10/18 and it was noted in office note by Dr. Caryn Section on 02/10/18 that "DM Uncontrolled due to not taking metformin for several months. Discussed trial of Jardiance of Victoza. Will see how electrolytes look before starting new medication."    Addendum 02/18/2018@14 :00-Spoke with patient about diabetes and home regimen for diabetes control. Patient reports that she is followed by PCP for diabetes management. Patient reports that she has not taken any DM medications in several weeks because "my doctor said we weren't going to worry about  it right now due to my other issues that needed to be taken care of right away."  Inquired about the Metformin listed on home medication list and patient reports that when she was taking Metformin she only took it 2-3 times per week because it made her so nauseous.  Inquired if she had informed her PCP about nausea with medication and she stated she had not. Patient reports that she has everything at home to check glucose but she does not usually check her glucose very often.   Discussed recent A1C results (9.4% on 01/31/18) and explained that her current A1C indicates an average glucose of 223 mg/dl over the past 2-3 months. Discussed glucose and A1C goals. Discussed importance of checking CBGs and maintaining good CBG control to prevent long-term and short-term complications. Explained how hyperglycemia leads to damage within blood vessels which lead to the common complications seen with uncontrolled diabetes. Stressed to the patient the importance of improving glycemic control to prevent further complications from uncontrolled diabetes. Explained to patient that she will definitely need to take DM medications as an outpatient. Explained that if she does not tolerate Metformin, there are other DM medications that could be prescribed. Encouraged patient to check her glucose at least 1-2 times per day and follow up with PCP regarding DM control. Patient states that she would be willing to check her glucose at home and to take a different DM medication if prescribed since she does not tolerate Metformin.  Patient verbalized understanding of information discussed and she states that she has no further questions at this  time related to diabetes.  Thanks, Barnie Alderman, RN, MSN, CDE Diabetes Coordinator Inpatient Diabetes Program [phone] (Team Pager from 8am to 5pm)

## 2018-02-14 NOTE — Consult Note (Signed)
Cardiology Consultation:   Patient ID: Crystal Pacheco MRN: [id number]; DOB: 1950/05/07  Admit date: 01/27/2018 Date of Consult: 01/25/2018  Primary Care Provider: Birdie Sons, MD Primary Cardiologist: Ida Rogue, MD  Primary Electrophysiologist:  None    Patient Profile:   Crystal Pacheco is a 67 y.o. female with a hx of Afib with RVR, CAD, chronic combined systolic and diastolic heart failure, hypertension, hyperlipidemia, remote tobacco abuse, COPD, prior CVA, and DM2 who is being seen today for the evaluation of Atrial fibrillation s/p cancelled 11/25 TEE/DCCV d/t renal failure and at the request of Dr. Manuella Ghazi.  History of Present Illness:   Crystal Pacheco is a 67 year old female with complex past medical history as above, including CAD s/p PCI and stenting to the obtuse marginal in 2006.  She was also found to have total occlusion of the RCA at that time.  Postprocedure course was complicated by pseudoaneurysm.  While previously followed by Dr. Rockey Situ, she has not been seen in clinic since 2015.  Per documentation in the interim, she has been followed by her PCP with most recent [facility] office visit 11/21.    Over the last 4 years, she has reportedly been CP free though with chronic DOE.  Over the last 3 to 6 months, she has experienced progressive lower extremity claudication in her calves.  This pain has reportedly escalated to the point of interfering with ambulation, reportedly only being able to walk about 7 to 10 feet before having to stop and rest. With rest, she reported symptom resolution after approximately 3 minutes. She denied pain at rest or any h/o skin breakdown.   Over the last month, she noticed progressive lower extremity edema and fatigue in addition to her progressive claudication.  Due to the above limits in her activity, she was uncertain if this was accompanied with DOE.  When seen by her PCP, she was found to be hypotensive, tachycardic with rate 141 bpm,  and with EKG significant for atrial fibrillation with RVR. Her metoprolol was therefore increased for rate control and lisinopril discontinued due to to soft blood pressure.  In addition, her Lasix was changed to torsemide for further diuresis.  Given her CHA2DS2VASc score of at least  8, she was placed on Xarelto 20mg  po qd and TTE ordered.   On 11/14, she was reportedly seen at f/u with weight documented as down 5 lbs. 11/11 labs were reviewed and showed hypokalemia with KCl repletion initiated. On 11/18, TTE showed mild LV dysfunction with EF 45-50% and somewhat similar to previous results on a nuclear study in 2007. She continued to undergo diuresis until seen at [facility] outpatient with weight down to 225 lbs, a 28 lb decrease since 11/11. She remained in Afib without reported palpitations or chest pain. She reported stable breathing but stilll with limited levels of activity. Lower extremity edema had improved but she did not progressive pedal edema as the day progressed. She denied PND, orthopnea, dizziness, syncope, or early satiety. Labs following the appointment showed abnormal kidney function in the setting of diuresis with recommendations to hold torsemide over the weekend and increase oral hydration. Given her hypokalemia, she was instructed to take 3 tabs KCl 40mEq on 11/22 then 34mEq (1/2 tablet) daily over the weeekend. She was instructed to report to the ED if worsening sx over the weekend.  Today 11/25, she was scheduled for stat BMET then TEE/DCCV if renal function allowed; however, due to renal failure, the TEE/DCCV was canceled  and patient admitted to the ICU. She remained in Afib with HR in the 130s and cardizem bolus and drip started for rate control.   Past Medical History:  Diagnosis Date  . Anemia   . Anxiety   . Cerebral infarction ([place]) 2006  . Chronic combined systolic (congestive) and diastolic (congestive) heart failure ([place])    a. 08/2005 MV: EF 42%; b. 01/2018  Echo: EF 45-50%. Mild to mod MR.  Marland Kitchen COPD (chronic obstructive pulmonary disease) ([place])   . Coronary artery disease 2006   a. 01/2005 PCI: DES->OM. RCA 100p. Complicated by groin PSA; b 08/2005 MV: EF 42%, anterior and high lateral scar w/ peri-inf ischemia.  . Essential hypertension   . GERD (gastroesophageal reflux disease)   . MI (myocardial infarction) ([place]) 2006  . Persistent atrial fibrillation    a. Dx 01/2018. CHA2DS2VASc = 8-->Xarelto.  . Type II diabetes mellitus ([place])     Past Surgical History:  Procedure Laterality Date  . CARDIAC CATHETERIZATION    . Carotid surgery stent    . CORONARY ANGIOPLASTY WITH STENT PLACEMENT  02/10/2005   Paclitaxel Eluting Taxus Express 2 Monorail 2.75 x 76mm left Cx-marg.  Marland Kitchen NM MYOVIEW LTD     Myoview EF= 42% High lateral wall defect      Home Medications:  Prior to Admission medications   Medication Sig Start Date End Date Taking? Authorizing Provider  allopurinol (ZYLOPRIM) 100 MG tablet Take 1 tablet (100 mg total) by mouth daily. 05/20/17  Yes Birdie Sons, MD  colchicine 0.6 MG tablet Take 2 tablets today, then one daily until gout is gone Patient taking differently: Take 0.6 mg by mouth daily as needed (for gout flares).  05/19/17  Yes Birdie Sons, MD  diphenhydrAMINE (BENADRYL) 25 mg capsule Take 25 mg by mouth 2 (two) times daily.   Yes [provider]  metFORMIN (GLUCOPHAGE-XR) 500 MG 24 hr tablet TAKE 1 TABLET BY MOUTH EVERY DAY WITH BREAKFAST Patient taking differently: Take 500 mg by mouth 3 (three) times a week. TAKE 1 TABLET BY MOUTH EVERY DAY WITH BREAKFAST 05/19/17  Yes Birdie Sons, MD  metoprolol tartrate (LOPRESSOR) 25 MG tablet Take 2 tablets (50 mg total) by mouth 2 (two) times daily. Patient taking differently: Take 50 mg by mouth daily.  01/31/18  Yes Fisher, Kirstie Peri, MD  NITROSTAT 0.4 MG SL tablet DISSOLVE 1 TABLET UNDER TONGUE EVERY 3 TO 5 MINUTES AS NEEDED FOR CHEST PAIN 11/12/14  Yes Birdie Sons, MD  omeprazole (PRILOSEC) 20 MG capsule TAKE 1 CAPSULE BY MOUTH TWO TIMES DAILY Patient taking differently: Take 20 mg by mouth 2 (two) times daily as needed (for acid reflux/indigestion.).  05/21/17  Yes Birdie Sons, MD  potassium chloride SA (K-DUR,KLOR-CON) 20 MEQ tablet Take 1 tablet (20 mEq total) by mouth daily. 02/03/18  Yes Birdie Sons, MD  rivaroxaban (XARELTO) 20 MG TABS tablet Take 1 tablet (20 mg total) by mouth daily with supper. 02/10/18  Yes Birdie Sons, MD  torsemide (DEMADEX) 100 MG tablet Take 1 tablet (100 mg total) by mouth daily. 01/31/18  Yes Birdie Sons, MD  ALPRAZolam Duanne Moron) 0.25 MG tablet TAKE 1/2-1 TABLET BY MOUTH TWICE DAILY AS NEEDED Patient not taking: Reported on 02/08/2018 05/21/17   Birdie Sons, MD  atorvastatin (LIPITOR) 80 MG tablet TAKE 1 TABLET EVERY DAY Patient not taking: Reported on 02/12/2018 05/20/17   Birdie Sons, MD  glucose blood test strip  Use as instructed 11/14/14   Birdie Sons, MD    Inpatient Medications: Scheduled Meds: . docusate sodium  100 mg Oral BID   Continuous Infusions: . sodium chloride    . sodium chloride     PRN Meds: acetaminophen **OR** acetaminophen, bisacodyl, HYDROcodone-acetaminophen, ondansetron **OR** ondansetron (ZOFRAN) IV, traZODone  Allergies:   No Known Allergies  Social History:   Social History   Socioeconomic History  . Marital status: Married    Spouse name: Not on file  . Number of children: 1  . Years of education: Not on file  . Highest education level: Not on file  Occupational History  . Occupation: Retired  Scientific laboratory technician  . Financial resource strain: Not on file  . Food insecurity:    Worry: Not on file    Inability: Not on file  . Transportation needs:    Medical: Not on file    Non-medical: Not on file  Tobacco Use  . Smoking status: Former Smoker    Packs/day: 1.00    Years: 20.00    Pack years: 20.00    Types: Cigarettes    Last attempt to quit:  11/15/2007    Years since quitting: 10.2  . Smokeless tobacco: Never Used  Substance and Sexual Activity  . Alcohol use: Yes    Alcohol/week: 2.0 standard drinks    Types: 2 Standard drinks or equivalent per week    Comment: occasional cocktail.  . Drug use: No  . Sexual activity: Not on file  Lifestyle  . Physical activity:    Days per week: Not on file    Minutes per session: Not on file  . Stress: Not on file  Relationships  . Social connections:    Talks on phone: Not on file    Gets together: Not on file    Attends religious service: Not on file    Active member of club or organization: Not on file    Attends meetings of clubs or organizations: Not on file    Relationship status: Not on file  . Intimate partner violence:    Fear of current or ex partner: Not on file    Emotionally abused: Not on file    Physically abused: Not on file    Forced sexual activity: Not on file  Other Topics Concern  . Not on file  Social History Narrative   Lives in [place] with husband.  Does not routinely exercise.  Activity limited by progressive LE claudication.    Family History:    Family History  Problem Relation Age of Onset  . CAD Mother        CABG in her 74's     ROS:  Please see the history of present illness.  Review of Systems  Constitutional: Positive for malaise/fatigue.  Eyes: Negative for blurred vision.  Respiratory: Negative for cough, shortness of breath and wheezing.        No current SOB in bed though reported increasing DOE leading up to admission  Cardiovascular: Positive for claudication and leg swelling. Negative for chest pain and palpitations.       Chronic, progressive DOE, LEE, claudication as in HPI. No current SOB while in bed at this time.  Neurological: Negative for dizziness and weakness.  Psychiatric/Behavioral: The patient does not have insomnia.     All other ROS reviewed and negative.     Physical Exam/Data:   Vitals:   02/13/2018 0757  01/30/2018 0758 02/07/2018 0759 01/28/2018 0800  BP:      Pulse: (!) 129 (!) 136 (!) 143 (!) 134  Resp:      Temp:      TempSrc:      SpO2: 99% 97% 98% 97%  Weight:      Height:       No intake or output data in the 24 hours ending 01/26/2018 0920 Filed Weights   02/04/2018 0708  Weight: 99.8 kg   Body mass index is 37.76 kg/m.  General:  Obese, in no acute distress. Pleasant. HEENT: normal Lymph: no adenopathy Neck: JVD difficult to assess d/t body habitus Vascular: No carotid bruits Cardiac: IRIR; 1/6 systolic murmur Lungs:  Bibasilar crackles   Abd: Obese, nontender, no hepatomegaly  Ext: 2+ bilateral LEE Musculoskeletal:  No deformities Skin: warm and dry, dry skin  Neuro:  CNs 2-12 intact, no focal abnormalities noted Psych:  Normal, positive affect    EKG:  The EKG was personally reviewed and demonstrates:  Afib with ventricular rate 133, QTc 490 Telemetry:  Telemetry was personally reviewed and demonstrates:  IRIR with ventricular rates up into 120ss  Relevant CV Studies: 02/07/2018 TTE Study Conclusions - Left ventricle: The cavity size was normal. Wall thickness was   normal. Systolic function was mildly reduced. The estimated   ejection fraction was in the range of 45% to 50%. - Aortic valve: Valve area (VTI): 0.98 cm^2. Valve area (Vmax):   1.54 cm^2. Valve area (Vmean): 1.37 cm^2. - Mitral valve: There was mild to moderate regurgitation.  Laboratory Data:  Chemistry Recent Labs  Lab 02/10/18 1218 02/10/18 1520 01/26/2018 0717  NA 132* 131* 130*  K 2.9* 3.0* 5.0  CL 85* 83* 87*  CO2 21 21 20*  GLUCOSE 166* 134* 183*  BUN 47* 49* 101*  CREATININE 3.72* 3.46* 6.87*  CALCIUM 8.8 8.5* 8.8*  GFRNONAA 12* 13* 6*  GFRAA 14* 15* 6*  ANIONGAP  --   --  23*    Recent Labs  Lab 02/10/18 1218  PROT 6.2  ALBUMIN 2.9*  AST 20  ALT 11  ALKPHOS 104  BILITOT 2.0*   Hematology Recent Labs  Lab 02/10/18 1520  WBC 18.4*  RBC 3.88  HGB 11.7  HCT 34.5  MCV  89  MCH 30.2  MCHC 33.9  RDW 13.8  PLT 220   Cardiac EnzymesNo results for input(s): TROPONINI in the last 168 hours. No results for input(s): TROPIPOC in the last 168 hours.  BNP Recent Labs  Lab 02/10/18 1218  BNP 2,052.4*    DDimer No results for input(s): DDIMER in the last 168 hours.  Radiology/Studies:  No results found.  Assessment and Plan:   1. Afib with RVR - CHADS2VASc score of at least 8 (CHF, HTN, Agex1, DM2, vascular, CVAx2, female) - Heparin drip per pharmacy. Holding Xarelto for now and in the setting of CrCl 9.59mL/min - As above, could not complete TEE/DCCV this AM 11/25 d/t acute renal failure and therefore admitted for management and further evaluation - Ventricular rates 90s-120s currently with rate of low 100s while in the room. Continue cardizem for rate control. No further plans to address rhythm control until stable renal function - Management of renal failure as directly below. Slowing IVF d/t bibasilar crackles heard on exam. Further recommendations following stabilization of renal function.   2. Acute Renal Failure and Hyperkalemia - As above. TEE/DCCV this AM 11/25 cancelled d/t ARF despite holding torsemide over the weekend (11/22-11/25) with increased hydration. - 11/25 labs: Cr  6.87 (baseline Cr 0.97), K 5.0 - Per nephrology, slowing IVF d/t bibasilar crackles heard on exam - Avoid nephrotoxic agents as able. Caution with contrast procedures.  - Hold home metformin, ace/arb/spironolactone, diuretics with plan to restart if renal function returns to baseline. Hold BB d/t hypotension to prevent further renal failure / exacerbate prerenal causes. Will need renal dosing for any renally cleared medications - Daily BMET- Continue to monitor renal function and electrolytes. Monitor hyperkalemia closely with plan for fluids / insulin / glucose / calcium gluconate /kayexalate if needed. Recommend repeat EKG/BMET daily as monitor, Check Mg. - Per  nephrology  3. Chronic combined systolic and diastolic heart failure - TTE as above with EF 45-0% and mild to moderate MR - 11/21 BNP 2052.4 -No plans for diuresis in setting of ARF. No potassium supplementation given hyperkalemia.  Acute renal failure as above. Daily BMET. Continue to monitor renal function and electrolytes as above. Per nephrology, will slow IVF d/t bibasilar crackles. Patient reports LEE improving - 2+ pitting on exam as indicated above in physical exam.  4. CAD - Risk factors include smoker, HLD. No plans for ischemic evaluation at this time and in the setting of hypotension, ARF, hyperkalemia, and Afib with RVR. No ACE/ARB/spiro at this time d/t renal failure and hypotension. Continue to hold home nitro, BB, diuresis, potassium supplementation and can restart with stabilization of renal function and vitals.  5. HTN - Currently hypotensive, soft BP with most recent 101/69. Continue gentle IVF (as above, and per nephro, slowing IVF d/t bibasilar crackles at this time). BP will likely improve with IVF. Continue to hold home BB to prevent further pre-renal exacerbation of ARF at this time. Continue to monitor vitals  6. HLD - Home statin held in setting of significantly reduced renal function. No renal dosing for home statin -could consider restart following Cr improvement in the future and pending repeat BMET.   7. DM2 - No metformin given ARF - SSI per IM, Crit care  8. Remainder per IM, critical care   For questions or updates, please contact [place] Please consult [email or web address] for contact info under     Signed, Arvil Chaco, PA-C  01/21/2018 9:20 AM

## 2018-02-14 NOTE — H&P (Signed)
[facility] [place] NAME: Crystal Pacheco    MR#:  [id number]  DATE OF BIRTH:  1951-03-02  DATE OF ADMISSION:  01/23/2018  PRIMARY CARE PHYSICIAN: Birdie Sons, MD   REQUESTING/REFERRING PHYSICIAN: Dr Fletcher Anon  CHIEF COMPLAINT:  No chief complaint on file.   HISTORY OF PRESENT ILLNESS:  Crystal Pacheco  is a 67 y.o. female with a known history of CHF, COPD is being admitted for ARF. Patient was seen by PCP on 11/11 for worsening LE edema (for 2-3 weeks) associated with SOB. Patient was on furosemide to 40mg  daily which was increased by patient with a little improvement in swelling. PCP changed Lasix to Torsemide 100 mg daily and was referred to Cardio as she was noted to be in A.fib. She was scheduled by Cardio to have TEE & cardioversion today but was noted to have Creat 6.87 with BUN of 101 and was requested to get direct admission to [place]. Patient denies any other symptoms. PAST MEDICAL HISTORY:   Past Medical History:  Diagnosis Date  . Anemia   . Anxiety   . Cerebral infarction ([place]) 2006  . Chronic combined systolic (congestive) and diastolic (congestive) heart failure ([place])    a. 08/2005 MV: EF 42%; b. 01/2018 Echo: EF 45-50%. Mild to mod MR.  Marland Kitchen COPD (chronic obstructive pulmonary disease) ([place])   . Coronary artery disease 2006   a. 01/2005 PCI: DES->OM. RCA 100p. Complicated by groin PSA; b 08/2005 MV: EF 42%, anterior and high lateral scar w/ peri-inf ischemia.  . Essential hypertension   . GERD (gastroesophageal reflux disease)   . MI (myocardial infarction) ([place]) 2006  . Persistent atrial fibrillation    a. Dx 01/2018. CHA2DS2VASc = 8-->Xarelto.  . Type II diabetes mellitus ([place])     PAST SURGICAL HISTORY:   Past Surgical History:  Procedure Laterality Date  . CARDIAC CATHETERIZATION    . Carotid surgery stent    . CORONARY ANGIOPLASTY WITH STENT PLACEMENT  02/10/2005   Paclitaxel Eluting Taxus Express 2 Monorail 2.75  x 60mm left Cx-marg.  Marland Kitchen NM MYOVIEW LTD     Myoview EF= 42% High lateral wall defect     SOCIAL HISTORY:   Social History   Tobacco Use  . Smoking status: Former Smoker    Packs/day: 1.00    Years: 20.00    Pack years: 20.00    Types: Cigarettes    Last attempt to quit: 11/15/2007    Years since quitting: 10.2  . Smokeless tobacco: Never Used  Substance Use Topics  . Alcohol use: Yes    Alcohol/week: 2.0 standard drinks    Types: 2 Standard drinks or equivalent per week    Comment: occasional cocktail.    FAMILY HISTORY:   Family History  Problem Relation Age of Onset  . CAD Mother        CABG in her 76's    DRUG ALLERGIES:  No Known Allergies  REVIEW OF SYSTEMS:   Review of Systems  Constitutional: Negative for chills, fever and weight loss.  HENT: Negative for nosebleeds and sore throat.   Eyes: Negative for blurred vision.  Respiratory: Negative for cough, shortness of breath and wheezing.   Cardiovascular: Negative for chest pain, orthopnea, leg swelling and PND.  Gastrointestinal: Negative for abdominal pain, constipation, diarrhea, heartburn, nausea and vomiting.  Genitourinary: Negative for dysuria and urgency.  Musculoskeletal: Negative for back pain.  Skin: Negative for rash.  Neurological:  Negative for dizziness, speech change, focal weakness and headaches.  Endo/Heme/Allergies: Does not bruise/bleed easily.  Psychiatric/Behavioral: Negative for depression.    MEDICATIONS AT HOME:   Prior to Admission medications   Medication Sig Start Date End Date Taking? Authorizing Provider  allopurinol (ZYLOPRIM) 100 MG tablet Take 1 tablet (100 mg total) by mouth daily. 05/20/17  Yes Birdie Sons, MD  colchicine 0.6 MG tablet Take 2 tablets today, then one daily until gout is gone Patient taking differently: Take 0.6 mg by mouth daily as needed (for gout flares).  05/19/17  Yes Birdie Sons, MD  diphenhydrAMINE (BENADRYL) 25 mg capsule Take 25 mg by  mouth 2 (two) times daily.   Yes [provider]  metFORMIN (GLUCOPHAGE-XR) 500 MG 24 hr tablet TAKE 1 TABLET BY MOUTH EVERY DAY WITH BREAKFAST Patient taking differently: Take 500 mg by mouth 3 (three) times a week. TAKE 1 TABLET BY MOUTH EVERY DAY WITH BREAKFAST 05/19/17  Yes Birdie Sons, MD  metoprolol tartrate (LOPRESSOR) 25 MG tablet Take 2 tablets (50 mg total) by mouth 2 (two) times daily. Patient taking differently: Take 50 mg by mouth daily.  01/31/18  Yes Fisher, Kirstie Peri, MD  NITROSTAT 0.4 MG SL tablet DISSOLVE 1 TABLET UNDER TONGUE EVERY 3 TO 5 MINUTES AS NEEDED FOR CHEST PAIN 11/12/14  Yes Birdie Sons, MD  omeprazole (PRILOSEC) 20 MG capsule TAKE 1 CAPSULE BY MOUTH TWO TIMES DAILY Patient taking differently: Take 20 mg by mouth 2 (two) times daily as needed (for acid reflux/indigestion.).  05/21/17  Yes Birdie Sons, MD  potassium chloride SA (K-DUR,KLOR-CON) 20 MEQ tablet Take 1 tablet (20 mEq total) by mouth daily. 02/03/18  Yes Birdie Sons, MD  rivaroxaban (XARELTO) 20 MG TABS tablet Take 1 tablet (20 mg total) by mouth daily with supper. 02/10/18  Yes Birdie Sons, MD  torsemide (DEMADEX) 100 MG tablet Take 1 tablet (100 mg total) by mouth daily. 01/31/18  Yes Birdie Sons, MD  ALPRAZolam Duanne Moron) 0.25 MG tablet TAKE 1/2-1 TABLET BY MOUTH TWICE DAILY AS NEEDED Patient not taking: Reported on 02/08/2018 05/21/17   Birdie Sons, MD  atorvastatin (LIPITOR) 80 MG tablet TAKE 1 TABLET EVERY DAY Patient not taking: Reported on 01/23/2018 05/20/17   Birdie Sons, MD  glucose blood test strip Use as instructed 11/14/14   Birdie Sons, MD      VITAL SIGNS:  Blood pressure (!) 126/95, pulse (!) 134, temperature (!) 97.5 F (36.4 C), temperature source Oral, resp. rate 20, height 5\' 4"  (1.626 m), weight 99.8 kg, SpO2 97 %.  PHYSICAL EXAMINATION:  Physical Exam  Constitutional: She is oriented to person, place, and time.  HENT:  Head:  Normocephalic and atraumatic.  Eyes: Pupils are equal, round, and reactive to light. Conjunctivae and EOM are normal.  Neck: Normal range of motion. Neck supple. No tracheal deviation present. No thyromegaly present.  Cardiovascular: Normal rate, regular rhythm and normal heart sounds.  Pulmonary/Chest: Effort normal and breath sounds normal. No respiratory distress. She has no wheezes. She exhibits no tenderness.  Abdominal: Soft. Bowel sounds are normal. She exhibits no distension. There is no tenderness.  Musculoskeletal: Normal range of motion.  Neurological: She is alert and oriented to person, place, and time. No cranial nerve deficit.  Skin: Skin is warm and dry. No rash noted.   LABORATORY PANEL:   CBC Recent Labs  Lab 02/10/18 1520  WBC 18.4*  HGB 11.7  HCT 34.5  PLT 220   ------------------------------------------------------------------------------------------------------------------  Chemistries  Recent Labs  Lab 02/10/18 1218  02/03/2018 0717  NA 132*   < > 130*  K 2.9*   < > 5.0  CL 85*   < > 87*  CO2 21   < > 20*  GLUCOSE 166*   < > 183*  BUN 47*   < > 101*  CREATININE 3.72*   < > 6.87*  CALCIUM 8.8   < > 8.8*  MG 1.8  --   --   AST 20  --   --   ALT 11  --   --   ALKPHOS 104  --   --   BILITOT 2.0*  --   --    < > = values in this interval not displayed.   ------------------------------------------------------------------------------------------------------------------  Cardiac Enzymes No results for input(s): TROPONINI in the last 168 hours. ------------------------------------------------------------------------------------------------------------------  RADIOLOGY:  No results found.  IMPRESSION AND PLAN:  64 y f with ARF  * ARF - likely ATN - c/s Nephro - hold Diuretics and any nephrotoxic meds  * A.fib with RVR - Heparin and Cardizem drip for now - Hold Xarelto - Cardio c/s  * Chronic combined systolic and diastolic heart failure -  well compensated at this time  * DM - SSI for now   All the records are reviewed and case discussed with ED provider. Management plans discussed with the patient, Dr Fletcher Anon and they are in agreement.  CODE STATUS: FULL CODE  TOTAL TIME (Critical Care) TAKING CARE OF THIS PATIENT: 45 minutes.    Max Sane M.D on 02/06/2018 at 9:10 AM  Between 7am to 6pm - Pager - [phone]  After 6pm go to [email or web address] - Proofreader  [facility]  Office  [phone]  CC: Primary care physician; Birdie Sons, MD   Note: This dictation was prepared with Dragon dictation along with smaller phrase technology. Any transcriptional errors that result from this process are unintentional.

## 2018-02-14 NOTE — Progress Notes (Signed)
Paged Dr. Fletcher Anon and Jeneen Rinks from cath lab called back and RN asked about patient's heart rate being in 130's afib.  Dr. Fletcher Anon gave order for cardizem drip.

## 2018-02-15 ENCOUNTER — Inpatient Hospital Stay: Payer: Medicare Other

## 2018-02-15 ENCOUNTER — Encounter: Payer: Self-pay | Admitting: Cardiovascular Disease

## 2018-02-15 LAB — CBC
HCT: 39.5 % (ref 36.0–46.0)
Hemoglobin: 12.4 g/dL (ref 12.0–15.0)
MCH: 29 pg (ref 26.0–34.0)
MCHC: 31.4 g/dL (ref 30.0–36.0)
MCV: 92.5 fL (ref 80.0–100.0)
PLATELETS: 260 10*3/uL (ref 150–400)
RBC: 4.27 MIL/uL (ref 3.87–5.11)
RDW: 15.1 % (ref 11.5–15.5)
WBC: 18.6 10*3/uL — ABNORMAL HIGH (ref 4.0–10.5)
nRBC: 0 % (ref 0.0–0.2)

## 2018-02-15 LAB — BASIC METABOLIC PANEL
ANION GAP: 19 — AB (ref 5–15)
BUN: 99 mg/dL — AB (ref 8–23)
CALCIUM: 8.6 mg/dL — AB (ref 8.9–10.3)
CO2: 23 mmol/L (ref 22–32)
CREATININE: 7.21 mg/dL — AB (ref 0.44–1.00)
Chloride: 90 mmol/L — ABNORMAL LOW (ref 98–111)
GFR calc Af Amer: 6 mL/min — ABNORMAL LOW (ref >60)
GFR, EST NON AFRICAN AMERICAN: 5 mL/min — AB (ref >60)
GLUCOSE: 104 mg/dL — AB (ref 70–99)
Potassium: 4.5 mmol/L (ref 3.5–5.1)
Sodium: 132 mmol/L — ABNORMAL LOW (ref 135–145)

## 2018-02-15 LAB — HIV ANTIBODY (ROUTINE TESTING W REFLEX): HIV Screen 4th Generation wRfx: NONREACTIVE

## 2018-02-15 LAB — PROTIME-INR
INR: 4.54
Prothrombin Time: 42.3 seconds — ABNORMAL HIGH (ref 11.4–15.2)

## 2018-02-15 LAB — APTT: APTT: 44 s — AB (ref 24–36)

## 2018-02-15 LAB — GLUCOSE, CAPILLARY
GLUCOSE-CAPILLARY: 125 mg/dL — AB (ref 70–99)
GLUCOSE-CAPILLARY: 131 mg/dL — AB (ref 70–99)
Glucose-Capillary: 109 mg/dL — ABNORMAL HIGH (ref 70–99)
Glucose-Capillary: 134 mg/dL — ABNORMAL HIGH (ref 70–99)

## 2018-02-15 LAB — PHOSPHORUS: Phosphorus: 7.1 mg/dL — ABNORMAL HIGH (ref 2.5–4.6)

## 2018-02-15 LAB — PROCALCITONIN: Procalcitonin: 3.36 ng/mL

## 2018-02-15 LAB — MAGNESIUM: MAGNESIUM: 2.1 mg/dL (ref 1.7–2.4)

## 2018-02-15 MED ORDER — METOPROLOL TARTRATE 25 MG PO TABS
12.5000 mg | ORAL_TABLET | Freq: Four times a day (QID) | ORAL | Status: DC
  Administered 2018-02-15 (×2): 12.5 mg via ORAL
  Filled 2018-02-15 (×2): qty 1

## 2018-02-15 MED ORDER — ROSUVASTATIN CALCIUM 10 MG PO TABS
10.0000 mg | ORAL_TABLET | Freq: Every day | ORAL | Status: DC
  Administered 2018-02-15 – 2018-02-17 (×3): 10 mg via ORAL
  Filled 2018-02-15 (×3): qty 1

## 2018-02-15 MED ORDER — METOPROLOL TARTRATE 25 MG PO TABS
25.0000 mg | ORAL_TABLET | Freq: Four times a day (QID) | ORAL | Status: DC
  Administered 2018-02-15 – 2018-02-16 (×2): 25 mg via ORAL
  Filled 2018-02-15 (×2): qty 1

## 2018-02-15 MED ORDER — METOPROLOL TARTRATE 25 MG PO TABS
12.5000 mg | ORAL_TABLET | Freq: Once | ORAL | Status: AC
  Administered 2018-02-15: 12.5 mg via ORAL
  Filled 2018-02-15: qty 1

## 2018-02-15 MED ORDER — SODIUM CHLORIDE 0.9 % IV SOLN
1.0000 g | Freq: Every day | INTRAVENOUS | Status: DC
  Administered 2018-02-15 – 2018-02-17 (×3): 1 g via INTRAVENOUS
  Filled 2018-02-15 (×4): qty 1

## 2018-02-15 MED ORDER — ASPIRIN EC 81 MG PO TBEC
81.0000 mg | DELAYED_RELEASE_TABLET | Freq: Every day | ORAL | Status: DC
  Administered 2018-02-15 – 2018-02-17 (×3): 81 mg via ORAL
  Filled 2018-02-15 (×3): qty 1

## 2018-02-15 NOTE — Progress Notes (Addendum)
[facility] [place] NAME: Tiesha Marich    MR#:  [id number]  DATE OF BIRTH:  [birth year]  SUBJECTIVE:  CHIEF COMPLAINT:  No chief complaint on file. no complaints  REVIEW OF SYSTEMS:  Review of Systems  Constitutional: Negative for diaphoresis, fever, malaise/fatigue and weight loss.  HENT: Negative for ear discharge, ear pain, hearing loss, nosebleeds, sore throat and tinnitus.   Eyes: Negative for blurred vision and pain.  Respiratory: Negative for cough, hemoptysis, shortness of breath and wheezing.   Cardiovascular: Negative for chest pain, palpitations, orthopnea and leg swelling.  Gastrointestinal: Negative for abdominal pain, blood in stool, constipation, diarrhea, heartburn, nausea and vomiting.  Genitourinary: Negative for dysuria, frequency and urgency.  Musculoskeletal: Negative for back pain and myalgias.  Skin: Negative for itching and rash.  Neurological: Negative for dizziness, tingling, tremors, focal weakness, seizures, weakness and headaches.  Psychiatric/Behavioral: Negative for depression. The patient is not nervous/anxious.     DRUG ALLERGIES:  No Known Allergies VITALS:  Blood pressure 125/76, pulse (!) 125, temperature 97.6 F (36.4 C), temperature source Oral, resp. rate 20, height 5\' 4"  (1.626 m), weight 109.5 kg, SpO2 91 %. PHYSICAL EXAMINATION:  Physical Exam  Constitutional: She is oriented to person, place, and time.  HENT:  Head: Normocephalic and atraumatic.  Eyes: Pupils are equal, round, and reactive to light. Conjunctivae and EOM are normal.  Neck: Normal range of motion. Neck supple. No tracheal deviation present. No thyromegaly present.  Cardiovascular: Normal rate, regular rhythm and normal heart sounds.  Pulmonary/Chest: Effort normal and breath sounds normal. No respiratory distress. She has no wheezes. She exhibits no tenderness.  Abdominal: Soft. Bowel sounds are normal. She exhibits no  distension. There is no tenderness.  Musculoskeletal: Normal range of motion.  Neurological: She is alert and oriented to person, place, and time. No cranial nerve deficit.  Skin: Skin is warm and dry. No rash noted.   LABORATORY PANEL:  Female CBC Recent Labs  Lab 02/15/18 0306  WBC 18.6*  HGB 12.4  HCT 39.5  PLT 260   ------------------------------------------------------------------------------------------------------------------ Chemistries  Recent Labs  Lab 02/10/18 1218  02/15/18 0306  NA 132*   < > 132*  K 2.9*   < > 4.5  CL 85*   < > 90*  CO2 21   < > 23  GLUCOSE 166*   < > 104*  BUN 47*   < > 99*  CREATININE 3.72*   < > 7.21*  CALCIUM 8.8   < > 8.6*  MG 1.8  --  2.1  AST 20  --   --   ALT 11  --   --   ALKPHOS 104  --   --   BILITOT 2.0*  --   --    < > = values in this interval not displayed.   RADIOLOGY:  US Renal  Result Date: 02/02/2018 CLINICAL DATA:  Initial evaluation for acute renal failure. EXAM: RENAL / URINARY TRACT ULTRASOUND COMPLETE COMPARISON:  None. FINDINGS: Right Kidney: Renal measurements: 5.6 x 2.4 x 1.7 cm = volume: 12 mL. Diffusely increased echogenicity with chronic cortical thinning. No hydronephrosis. 8 x 6 x 9 mm simple cyst present at the interpolar region. Additional 9 x 6 x 7 mm cyst noted at the mid-lower pole. No mass or hydronephrosis visualized. Left Kidney: Renal measurements: 11.0 x 6.5 x 6.7 cm = volume: 250 mL. Echogenicity within normal limits. No mass or hydronephrosis visualized.  Bladder: Appears normal for degree of bladder distention. Incidental note made of a 3.2 cm shadowing stone within the gallbladder lumen. Probable associated sludge noted. 1.4 x 1.3 x 1.6 cm hypoechoic avascular lesion within the right hepatic lobe noted, possibly a small cyst. Finding of doubtful significance. IMPRESSION: 1. Asymmetric right renal atrophy.  No hydronephrosis. 2. Subcentimeter simple right renal cysts as above. 3. Cholelithiasis with  gallbladder sludging. Electronically Signed   By: Jeannine Boga M.D.   On: 02/18/2018 14:26   Dg Chest Port 1 View  Result Date: 02/10/2018 CLINICAL DATA:  67 year old female with suspected congestive heart failure. EXAM: PORTABLE CHEST 1 VIEW COMPARISON:  Chest radiograph 09/11/2011 and earlier. FINDINGS: AP upright view at 1428 hours. Asymmetric left greater than right lung opacity appears related to a combination of moderate size left pleural effusion and left perihilar airspace opacity. Small if any right pleural effusion. The right lung pulmonary vasculature has a more normal appearance. The left hilum is indistinct. The other mediastinal contours are stable since 2013. Visualized tracheal air column is within normal limits. No pneumothorax. Paucity bowel gas in the upper abdomen. IMPRESSION: Combined left pleural effusion and left lung airspace opacity might reflect CHF with asymmetric pulmonary edema, but a left lung Pneumonia and/or left hilar Obstructing Mass are not excluded. Recommend follow-up radiographs versus further characterization now with chest CT (IV contrast preferred). Electronically Signed   By: Genevie Ann M.D.   On: 02/06/2018 14:42   ASSESSMENT AND PLAN:  47 y f with ARF  * ARF: creat 7.1 - likely ATN - Nephro following - hold Diuretics and any nephrotoxic meds - may need HD  * A.fib with RVR - Heparin and Cardizem stopped. On Metoprolol 12.5 Q 6 hrs - Hold Xarelto. INR 4.54 - Cardio following  * Chronic combined systolic and diastolic heart failure: EF 45-50% - well compensated at this time  * DM - SSI for now, hold metformin     All the records are reviewed and case discussed with Care Management/Social Worker. Management plans discussed with the patient, family and they are in agreement.  CODE STATUS: Full Code  TOTAL TIME TAKING CARE OF THIS PATIENT: 15 minutes.   More than 50% of the time was spent in counseling/coordination of care:  YES  POSSIBLE D/C IN 2-3 DAYS, DEPENDING ON CLINICAL CONDITION.   Max Sane M.D on 02/15/2018 at 12:34 PM  Between 7am to 6pm - Pager - [phone]  After 6pm go to [email or web address] - Proofreader  [facility]  Office  [phone]  CC: Primary care physician; Birdie Sons, MD  Note: This dictation was prepared with Dragon dictation along with smaller phrase technology. Any transcriptional errors that result from this process are unintentional.

## 2018-02-15 NOTE — Progress Notes (Signed)
Telephone report called to Philis Nettle, RN on 2A.  Patient taken via bed by nursing assistant to room 253.

## 2018-02-15 NOTE — Progress Notes (Signed)
Progress Note  Patient Name: Crystal Pacheco Date of Encounter: 02/15/2018  Primary Cardiologist: Ida Rogue, MD   Subjective   Patient denies SOB, chest pain, feeling of rapid heart rate, and palpitations this morning.  She reported she had not yet had any urine output since yesterday AM.   I/O Net +1154.3 HR 100-110s with brief escalation of ventricular rates this AM into the 130s BP 135/115 Cr 7.21 K 4.5 INR 8.05  4.54  Inpatient Medications    Scheduled Meds: . docusate sodium  100 mg Oral BID  . insulin aspart  0-5 Units Subcutaneous QHS  . insulin aspart  0-9 Units Subcutaneous TID WC   Continuous Infusions: . sodium chloride 50 mL/hr at 02/15/18 0100  . diltiazem (CARDIZEM) infusion 5 mg/hr (02/15/18 0100)   PRN Meds: acetaminophen **OR** acetaminophen, ALPRAZolam, bisacodyl, HYDROcodone-acetaminophen, ipratropium-albuterol, ondansetron **OR** ondansetron (ZOFRAN) IV, traZODone   Vital Signs    Vitals:   02/15/18 0310 02/15/18 0400 02/15/18 0500 02/15/18 0600  BP: (!) 108/48 121/68 124/89 118/89  Pulse: (!) 114 98 (!) 113 (!) 110  Resp: (!) 22 20 20  (!) 22  Temp:      TempSrc:      SpO2: 94% 92% 93% (!) 89%  Weight:   109.5 kg   Height:        Intake/Output Summary (Last 24 hours) at 02/15/2018 0729 Last data filed at 02/15/2018 0100 Gross per 24 hour  Intake 1204.33 ml  Output 50 ml  Net 1154.33 ml   Filed Weights   02/09/2018 0708 02/04/2018 1000 02/15/18 0500  Weight: 99.8 kg 108.4 kg 109.5 kg    Telemetry    IRIR, rates 100 to 110s with brief elevation into 130s this AM - Personally Reviewed  ECG    No new tracings - Personally Reviewed  Physical Exam   GEN: No acute distress.  Lying in bed Neck: No JVP difficult to assess d/t body habitus Cardiac: IRIR, tachycardic. 1/6 systolic murmur. No rubs, or gallops.  Respiratory: Clear to auscultation bilaterally with trace crackles and diminished breath sounds at bilateral bases. GI:  Obese. Soft, nontender, non-distended  MS: 2-3+ bilateral pitting edema; No deformity. Neuro:  Nonfocal  Psych: Normal affect, pleasant    Labs    Chemistry Recent Labs  Lab 02/10/18 1218 02/10/18 1520 02/02/2018 0717 02/15/18 0306  NA 132* 131* 130* 132*  K 2.9* 3.0* 5.0 4.5  CL 85* 83* 87* 90*  CO2 21 21 20* 23  GLUCOSE 166* 134* 183* 104*  BUN 47* 49* 101* 99*  CREATININE 3.72* 3.46* 6.87* 7.21*  CALCIUM 8.8 8.5* 8.8* 8.6*  PROT 6.2  --   --   --   ALBUMIN 2.9*  --   --   --   AST 20  --   --   --   ALT 11  --   --   --   ALKPHOS 104  --   --   --   BILITOT 2.0*  --   --   --   GFRNONAA 12* 13* 6* 5*  GFRAA 14* 15* 6* 6*  ANIONGAP  --   --  23* 19*     Hematology Recent Labs  Lab 02/10/18 1520 02/06/2018 1022 02/15/18 0306  WBC 18.4* 18.1* 18.6*  RBC 3.88 4.17 4.27  HGB 11.7 12.2 12.4  HCT 34.5 38.9 39.5  MCV 89 93.3 92.5  MCH 30.2 29.3 29.0  MCHC 33.9 31.4 31.4  RDW 13.8 14.8 15.1  PLT 220 247 260    Cardiac EnzymesNo results for input(s): TROPONINI in the last 168 hours. No results for input(s): TROPIPOC in the last 168 hours.   BNP Recent Labs  Lab 02/10/18 1218  BNP 2,052.4*     DDimer No results for input(s): DDIMER in the last 168 hours.   Radiology    US Renal  Result Date: 02/12/2018 CLINICAL DATA:  Initial evaluation for acute renal failure. EXAM: RENAL / URINARY TRACT ULTRASOUND COMPLETE COMPARISON:  None. FINDINGS: Right Kidney: Renal measurements: 5.6 x 2.4 x 1.7 cm = volume: 12 mL. Diffusely increased echogenicity with chronic cortical thinning. No hydronephrosis. 8 x 6 x 9 mm simple cyst present at the interpolar region. Additional 9 x 6 x 7 mm cyst noted at the mid-lower pole. No mass or hydronephrosis visualized. Left Kidney: Renal measurements: 11.0 x 6.5 x 6.7 cm = volume: 250 mL. Echogenicity within normal limits. No mass or hydronephrosis visualized. Bladder: Appears normal for degree of bladder distention. Incidental note made of a  3.2 cm shadowing stone within the gallbladder lumen. Probable associated sludge noted. 1.4 x 1.3 x 1.6 cm hypoechoic avascular lesion within the right hepatic lobe noted, possibly a small cyst. Finding of doubtful significance. IMPRESSION: 1. Asymmetric right renal atrophy.  No hydronephrosis. 2. Subcentimeter simple right renal cysts as above. 3. Cholelithiasis with gallbladder sludging. Electronically Signed   By: Jeannine Boga M.D.   On: 02/02/2018 14:26   Dg Chest Port 1 View  Result Date: 02/09/2018 CLINICAL DATA:  67 year old female with suspected congestive heart failure. EXAM: PORTABLE CHEST 1 VIEW COMPARISON:  Chest radiograph 09/11/2011 and earlier. FINDINGS: AP upright view at 1428 hours. Asymmetric left greater than right lung opacity appears related to a combination of moderate size left pleural effusion and left perihilar airspace opacity. Small if any right pleural effusion. The right lung pulmonary vasculature has a more normal appearance. The left hilum is indistinct. The other mediastinal contours are stable since 2013. Visualized tracheal air column is within normal limits. No pneumothorax. Paucity bowel gas in the upper abdomen. IMPRESSION: Combined left pleural effusion and left lung airspace opacity might reflect CHF with asymmetric pulmonary edema, but a left lung Pneumonia and/or left hilar Obstructing Mass are not excluded. Recommend follow-up radiographs versus further characterization now with chest CT (IV contrast preferred). Electronically Signed   By: Genevie Ann M.D.   On: 01/23/2018 14:42    Cardiac Studies   02/07/2018 TTE Study Conclusions - Left ventricle: The cavity size was normal. Wall thickness was normal. Systolic function was mildly reduced. The estimated ejection fraction was in the range of 45% to 50%. - Aortic valve: Valve area (VTI): 0.98 cm^2. Valve area (Vmax): 1.54 cm^2. Valve area (Vmean): 1.37 cm^2. - Mitral valve: There was mild to  moderate regurgitation.  Patient Profile     67 y.o. female with a h/o Afib with RVR, CAD, chronic combined systolic and diastolic heart failure, HTN, HLD, remote tobacco abuse, COPD, prior CVA, and DM2 seen for the management of Afib with RVR s/p TEE/DCCV cancellation d/t renal failure.  Assessment & Plan    Afib with RVR - Discontinued heparin d/t renal insufficiency - patient has been on Xarelto with ARF and suspect accumulation. CHA2DS2VASc score of at least  8.  - INR 8.05  4.54 - Plan for rate control at this time with metoprolol tartrate 12.5mg  q6h and discontining diltiazem drip.  Will continue to monitor. HR currently controlled with ventricular rates 110s-130s. -  Will check INR - Recommend TEE/ DCCV if possible or DCCV s/p 3 weeks anticoagulation as an outpatient   ARF  - Likely ATN from overdiuresis - Daily BMET. Cr 6.87  7.21 (baseline 0.97); K 5.0  4.5 - Continue to hold nephrotoxins as above - Gentle IVF. May need HD - Per nephrology  CAD - Chest pain free - Previous PCI - Continue medical therapy as renal function and vitals permit. Recommend continue to hold nephrotoxic drugs.  Chronic combined systolic and diastolic heart failure - TTE as above with EF 45-50% - Continue to hold all diuretics.  HTN - Soft BP with SBP 110s - Gentle IVF.  - Will switch from diltiazem drip to metoprolol po q6h given renal insufficiency as above  DM2 - Continue to hold metformin    For questions or updates, please contact [place] Please consult [email or web address] for contact info under        Signed, Arvil Chaco, PA-C  02/15/2018, 7:29 AM

## 2018-02-15 NOTE — Progress Notes (Signed)
[place], [place] 02/15/18  Subjective:   Patient remains critically ill.  BUN/creatinine are further increased to 99/7.2 Phosphorus is higher at 7.1 Urine output remains minimal Patient denies any shortness of breath.  She is trying to eat a little bit  Objective:  Vital signs in last 24 hours:  Temp:  [97.6 F (36.4 C)-98 F (36.7 C)] 98 F (36.7 C) (11/26 1421) Pulse Rate:  [94-132] 130 (11/26 1600) Resp:  [19-25] 23 (11/26 1600) BP: (92-146)/(48-119) 146/119 (11/26 1600) SpO2:  [89 %-94 %] 92 % (11/26 1600) Weight:  [109.5 kg] 109.5 kg (11/26 0500)  Weight change:  Filed Weights   02/03/2018 0708 02/04/2018 1000 02/15/18 0500  Weight: 99.8 kg 108.4 kg 109.5 kg    Intake/Output:    Intake/Output Summary (Last 24 hours) at 02/15/2018 1648 Last data filed at 02/15/2018 1600 Gross per 24 hour  Intake 1512.9 ml  Output 50 ml  Net 1462.9 ml    Physical Exam: General:  Morbidly obese lady, lying in the bed  HEENT  anicteric, moist oral mucous membranes  Neck:  Short, thick, supple  Lungs:  Limited exam, mild crackles  Heart::  Tachycardic, irregular, atrial fibrillation  Abdomen:  Soft, distended  Extremities:  2+ edema  Neurologic:  Alert, able to answer questions  Skin:  Dry skin      Basic Metabolic Panel:  Recent Labs  Lab 02/10/18 1218 02/10/18 1520 02/04/2018 0717 02/15/18 0306  NA 132* 131* 130* 132*  K 2.9* 3.0* 5.0 4.5  CL 85* 83* 87* 90*  CO2 21 21 20* 23  GLUCOSE 166* 134* 183* 104*  BUN 47* 49* 101* 99*  CREATININE 3.72* 3.46* 6.87* 7.21*  CALCIUM 8.8 8.5* 8.8* 8.6*  MG 1.8  --   --  2.1  PHOS  --   --   --  7.1*     CBC: Recent Labs  Lab 02/10/18 1520 01/31/2018 1022 02/15/18 0306  WBC 18.4* 18.1* 18.6*  NEUTROABS 16.2*  --   --   HGB 11.7 12.2 12.4  HCT 34.5 38.9 39.5  MCV 89 93.3 92.5  PLT 220 247 260     No results found for: HEPBSAG, HEPBSAB, HEPBIGM    Microbiology:  Recent Results  (from the past 240 hour(s))  MRSA PCR Screening     Status: None   Collection Time: 02/13/2018 10:06 AM  Result Value Ref Range Status   MRSA by PCR NEGATIVE NEGATIVE Final    Comment:        The GeneXpert MRSA Assay (FDA approved for NASAL specimens only), is one component of a comprehensive MRSA colonization surveillance program. It is not intended to diagnose MRSA infection nor to guide or monitor treatment for MRSA infections. Performed at [facility], [address] [place], [place] [address]     Coagulation Studies: Recent Labs    02/01/2018 1022 02/15/18 0855  LABPROT 66.0* 42.3*  INR 8.05* 4.54*    Urinalysis: Recent Labs    02/04/2018 1646  COLORURINE AMBER*  LABSPEC 1.013  PHURINE 5.0  GLUCOSEU NEGATIVE  HGBUR NEGATIVE  BILIRUBINUR NEGATIVE  KETONESUR NEGATIVE  PROTEINUR NEGATIVE  NITRITE NEGATIVE  LEUKOCYTESUR TRACE*      Imaging: Ct Chest Wo Contrast  Result Date: 02/15/2018 CLINICAL DATA:  67 year old female with pleural effusion. Question lung mass or pneumonia. Acute renal insufficiency. Subsequent encounter. EXAM: CT CHEST WITHOUT CONTRAST TECHNIQUE: Multidetector CT imaging of the chest was performed following the standard protocol without  IV contrast. COMPARISON:  01/26/2018 chest x-ray. FINDINGS: Cardiovascular: Mild cardiomegaly. Prominent coronary artery calcification. Aortic valve calcification. Atherosclerotic changes thoracic aorta. Ascending thoracic aorta measures up to 3.7 cm. Prominent size pulmonary arteries. Cannot exclude component of pulmonary hypertension. Mediastinum/Nodes: Subcarinal adenopathy measuring up to 1.6 cm short axis. Lungs/Pleura: Consolidation left upper and left lower lobe with moderate-size left-sided pleural effusion. Consolidation right lower lobe with small pleural effusion. Majority of bronchi are patent with the exception of distal left lower lobe bronchi. Right upper lobe 5 mm nodule (series 6, image 75 and  series 3, image 62). Upper Abdomen: Irregular contour of the liver with findings raising possibility of multiple liver lesions. Dilated gallbladder with 2 cm stone. Atrophic right kidney. Question left renal cyst. Musculoskeletal: Kyphoscoliosis thoracic spine without osseous destructive lesion. IMPRESSION: 1. Consolidation left upper and left lower lobe with moderate-size left-sided pleural effusion. Consolidation right lower lobe with small pleural effusion. Majority of bronchi are patent with the exception of distal left lower lobe bronchi. Findings may reflect infectious process given the bilateral lung involvement. 2. Recommend aggressively treating any clinically suspected infectious process with close follow-up chest and abdomen CT (preferably with contrast if patient's renal function improves), as it is difficult to exclude a left hilar mass. Additional finding of right upper lobe 5 mm nodule (series 6, image 75 and series 3, image 62) and possibility of subtle multiple liver lesions suggests that malignancy is also a consideration. 3. Subcarinal adenopathy with short axis dimension 1.6 cm. 4. Mild cardiomegaly.  Prominent coronary artery calcification. 5. Aortic Atherosclerosis (ICD10-I70.0). Ascending thoracic aorta measures up to 3.7 cm. 6. Prominent size pulmonary arteries. Cannot exclude component of pulmonary hypertension. 7. Dilated gallbladder 2 cm gallstone. 8. Atrophic right kidney. Electronically Signed   By: Genia Del M.D.   On: 02/15/2018 16:11   US Renal  Result Date: 02/13/2018 CLINICAL DATA:  Initial evaluation for acute renal failure. EXAM: RENAL / URINARY TRACT ULTRASOUND COMPLETE COMPARISON:  None. FINDINGS: Right Kidney: Renal measurements: 5.6 x 2.4 x 1.7 cm = volume: 12 mL. Diffusely increased echogenicity with chronic cortical thinning. No hydronephrosis. 8 x 6 x 9 mm simple cyst present at the interpolar region. Additional 9 x 6 x 7 mm cyst noted at the mid-lower pole. No  mass or hydronephrosis visualized. Left Kidney: Renal measurements: 11.0 x 6.5 x 6.7 cm = volume: 250 mL. Echogenicity within normal limits. No mass or hydronephrosis visualized. Bladder: Appears normal for degree of bladder distention. Incidental note made of a 3.2 cm shadowing stone within the gallbladder lumen. Probable associated sludge noted. 1.4 x 1.3 x 1.6 cm hypoechoic avascular lesion within the right hepatic lobe noted, possibly a small cyst. Finding of doubtful significance. IMPRESSION: 1. Asymmetric right renal atrophy.  No hydronephrosis. 2. Subcentimeter simple right renal cysts as above. 3. Cholelithiasis with gallbladder sludging. Electronically Signed   By: Jeannine Boga M.D.   On: 02/07/2018 14:26   Dg Chest Port 1 View  Result Date: 01/25/2018 CLINICAL DATA:  67 year old female with suspected congestive heart failure. EXAM: PORTABLE CHEST 1 VIEW COMPARISON:  Chest radiograph 09/11/2011 and earlier. FINDINGS: AP upright view at 1428 hours. Asymmetric left greater than right lung opacity appears related to a combination of moderate size left pleural effusion and left perihilar airspace opacity. Small if any right pleural effusion. The right lung pulmonary vasculature has a more normal appearance. The left hilum is indistinct. The other mediastinal contours are stable since 2013. Visualized tracheal air column is  within normal limits. No pneumothorax. Paucity bowel gas in the upper abdomen. IMPRESSION: Combined left pleural effusion and left lung airspace opacity might reflect CHF with asymmetric pulmonary edema, but a left lung Pneumonia and/or left hilar Obstructing Mass are not excluded. Recommend follow-up radiographs versus further characterization now with chest CT (IV contrast preferred). Electronically Signed   By: Genevie Ann M.D.   On: 02/07/2018 14:42     Medications:   . sodium chloride 50 mL/hr at 02/15/18 1600  . ceFEPime (MAXIPIME) IV Stopped (02/15/18 1448)   .  aspirin EC  81 mg Oral Daily  . docusate sodium  100 mg Oral BID  . insulin aspart  0-5 Units Subcutaneous QHS  . insulin aspart  0-9 Units Subcutaneous TID WC  . metoprolol tartrate  25 mg Oral Q6H  . rosuvastatin  10 mg Oral q1800   acetaminophen **OR** acetaminophen, ALPRAZolam, bisacodyl, HYDROcodone-acetaminophen, ipratropium-albuterol, ondansetron **OR** ondansetron (ZOFRAN) IV, traZODone  Assessment/ Plan:  67 y.o. female with  medical problems of chronic systolic CHF, COPD, coronary disease with history of angioplasty, hypertension, atrial fibrillation, diabetes type 2, non-insulin-dependent, who was admitted to [facility] on 01/24/2018  2D echo November 18: LVEF 45 to 50%, moderate mitral regurgitation  1.  Acute kidney injury 2.  Generalized edema 3.  Atrial fibrillation  Acute kidney injury is likely secondary to overdiuresis causing ATN  Plan: -Maintain hemodynamics to avoid hypotension -Hold further diuretic administration -Renal ultrasound suggests right renal atrophy.  No hydronephrosis. -Careful volume replacement to avoid fluid overload Electrolytes and volume status are acceptable.  No acute indication for dialysis We will follow closely    LOS: 1 Everitt Wenner Candiss Norse 11/26/20194:48 PM  [place], [place]  Note: This note was prepared with Dragon dictation. Any transcription errors are unintentional

## 2018-02-15 NOTE — Progress Notes (Addendum)
Name: Crystal Pacheco MRN: [id number] DOB: June 10, 1950     CONSULTATION DATE: 01/22/2018  Subjective & Objectives: Afebrile. Off  cardiazem gtt.  PAST MEDICAL HISTORY :   has a past medical history of Anemia, Anxiety, Cerebral infarction ([place]) (2006), Chronic combined systolic (congestive) and diastolic (congestive) heart failure ([place]), COPD (chronic obstructive pulmonary disease) ([place]), Coronary artery disease (2006), Essential hypertension, GERD (gastroesophageal reflux disease), MI (myocardial infarction) ([place]) (2006), Persistent atrial fibrillation, and Type II diabetes mellitus ([place]).  has a past surgical history that includes [facility] [employer] [facility]; Cardiac catheterization; Coronary angioplasty with stent (02/10/2005); Carotid surgery stent; TEE without cardioversion (N/A, 02/08/2018); and CARDIOVERSION (N/A, 01/31/2018). Prior to Admission medications   Medication Sig Start Date End Date Taking? Authorizing Provider  allopurinol (ZYLOPRIM) 100 MG tablet Take 1 tablet (100 mg total) by mouth daily. 05/20/17  Yes Birdie Sons, MD  colchicine 0.6 MG tablet Take 2 tablets today, then one daily until gout is gone Patient taking differently: Take 0.6 mg by mouth daily as needed (for gout flares).  05/19/17  Yes Birdie Sons, MD  diphenhydrAMINE (BENADRYL) 25 mg capsule Take 25 mg by mouth 2 (two) times daily.   Yes [provider]  metFORMIN (GLUCOPHAGE-XR) 500 MG 24 hr tablet TAKE 1 TABLET BY MOUTH EVERY DAY WITH BREAKFAST Patient taking differently: Take 500 mg by mouth 3 (three) times a week. TAKE 1 TABLET BY MOUTH EVERY DAY WITH BREAKFAST 05/19/17  Yes Birdie Sons, MD  metoprolol tartrate (LOPRESSOR) 25 MG tablet Take 2 tablets (50 mg total) by mouth 2 (two) times daily. Patient taking differently: Take 50 mg by mouth daily.  01/31/18  Yes Fisher, Kirstie Peri, MD  NITROSTAT 0.4 MG SL tablet DISSOLVE 1 TABLET UNDER TONGUE EVERY 3 TO 5 MINUTES AS NEEDED FOR CHEST PAIN 11/12/14  Yes  Birdie Sons, MD  omeprazole (PRILOSEC) 20 MG capsule TAKE 1 CAPSULE BY MOUTH TWO TIMES DAILY Patient taking differently: Take 20 mg by mouth 2 (two) times daily as needed (for acid reflux/indigestion.).  05/21/17  Yes Birdie Sons, MD  potassium chloride SA (K-DUR,KLOR-CON) 20 MEQ tablet Take 1 tablet (20 mEq total) by mouth daily. 02/03/18  Yes Birdie Sons, MD  rivaroxaban (XARELTO) 20 MG TABS tablet Take 1 tablet (20 mg total) by mouth daily with supper. 02/10/18  Yes Birdie Sons, MD  torsemide (DEMADEX) 100 MG tablet Take 1 tablet (100 mg total) by mouth daily. 01/31/18  Yes Birdie Sons, MD  ALPRAZolam Duanne Moron) 0.25 MG tablet TAKE 1/2-1 TABLET BY MOUTH TWICE DAILY AS NEEDED Patient not taking: Reported on 02/16/2018 05/21/17   Birdie Sons, MD  atorvastatin (LIPITOR) 80 MG tablet TAKE 1 TABLET EVERY DAY Patient not taking: Reported on 01/22/2018 05/20/17   Birdie Sons, MD  glucose blood test strip Use as instructed 11/14/14   Birdie Sons, MD   No Known Allergies  FAMILY HISTORY:  family history includes CAD in her mother. SOCIAL HISTORY:  reports that she quit smoking about 10 years ago. Her smoking use included cigarettes. She has a 20.00 pack-year smoking history. She has never used smokeless tobacco. She reports that she drinks about 2.0 standard drinks of alcohol per week. She reports that she does not use drugs.  REVIEW OF SYSTEMS:   Unable to obtain due to critical illness   VITAL SIGNS: Temp:  [97.6 F (36.4 C)-98 F (36.7 C)] 97.6 F (36.4 C) (11/26 0900) Pulse Rate:  [  94-125] 125 (11/26 1200) Resp:  [16-25] 20 (11/26 1200) BP: (92-148)/(48-115) 125/76 (11/26 1200) SpO2:  [89 %-94 %] 91 % (11/26 1200) Weight:  [109.5 kg] 109.5 kg (11/26 0500)  Physical Examination:  Awake and oriented with no focal neurological deficits Tolerating room air, no distress, able to talk in full sentences, bilateral equal air entry with no adventitious  sounds S1 & S2 are audible with no murmur Obese abdomen with normal peristalsis Bilateral leg edema 2+   ASSESSMENT / PLAN:  A. fib with RVR.  Echo 02/07/2018 LVEF 45 to 50%, off Cardizem drip, off heparin, s/p Xarelto because of AKI .  Patient was scheduled to have TEE/DCCV earlier today and has set on hold because of serum creatinine 6.87 GFR 6.  As per cardiology -Optimize rate control and accept HR up to 120/min -Management as per cardiology  AKI (worsening) with oligurea -Optimize hydration, avoid nephrotoxins, monitor renal panel and urine output -Renal ultrasound and follow with renal consult  CHF with bilateral leg edema. LVEF 45 to 50% -Achieve negative fluid balance as per nephrology considering worsening renal function  Atelectasis, Lt pl. Eff, pulmonary congestion and questionable lung mass on CXR. Infective etiology can not be ruled out -Empiric Cefep. MRSA PCR -ve -Monitor Procalcitonin + CXR + CBC + FIO2. -Consider de-escalation of ABX with clinical improvement -CT chest WO contrast.  COPD -Bronchodilators  Coronary artery disease, hypertension -Optimize antihypertensives, ASA + Statin + BB as tolerated and monitor hemodynamics -Management as per cardiology  Diabetes mellitus. HB A 1c 7.8 -Glycemic control  Dyslipidemia -Statins  Gouty arthritis and limited physical capacity being only able to go to the bathroom -Allopurinol and optimize analgesia   Coagulopathy due to Xarelto -Monitor cognition provide  Full code  Supportive care  DVT & GI prophylaxis.  Continue with supportive care Plan of care was discussed with the patient and she agreed to  Critical care time 45 minutes

## 2018-02-15 NOTE — Progress Notes (Signed)
Pharmacy Antibiotic Note  Crystal Pacheco is a 67 y.o. female admitted on 02/01/2018 with acute renal failure, atrial fibrillation, and combined heart failure. Patient's creatinine remains elevated  Pharmacy has been consulted for cefepime dosing for possible pneumonia.   Plan: Cefepime 1g IV Q24hr.   Height: 5\' 4"  (162.6 cm) Weight: 241 lb 6.5 oz (109.5 kg) IBW/kg (Calculated) : 54.7  Temp (24hrs), Avg:97.8 F (36.6 C), Min:97.6 F (36.4 C), Max:98 F (36.7 C)  Recent Labs  Lab 02/10/18 1218 02/10/18 1520 02/16/2018 0717 01/22/2018 1022 02/15/18 0306  WBC  --  18.4*  --  18.1* 18.6*  CREATININE 3.72* 3.46* 6.87*  --  7.21*    Estimated Creatinine Clearance: 9 mL/min (A) (by C-G formula based on SCr of 7.21 mg/dL (H)).    No Known Allergies  Antimicrobials this admission: Cefepime 11/26 >>   Dose adjustments this admission: N/A  Microbiology results: 11/25 MRSA PCR: negative   Thank you for allowing pharmacy to be a part of this patient's care.  Lafonda Patron L 02/15/2018 2:18 PM

## 2018-02-16 ENCOUNTER — Inpatient Hospital Stay: Payer: Medicare Other

## 2018-02-16 DIAGNOSIS — I5031 Acute diastolic (congestive) heart failure: Secondary | ICD-10-CM

## 2018-02-16 LAB — CBC WITH DIFFERENTIAL/PLATELET
ABS IMMATURE GRANULOCYTES: 0.43 10*3/uL — AB (ref 0.00–0.07)
BASOS PCT: 0 %
Basophils Absolute: 0.1 10*3/uL (ref 0.0–0.1)
EOS PCT: 1 %
Eosinophils Absolute: 0.2 10*3/uL (ref 0.0–0.5)
HCT: 39.8 % (ref 36.0–46.0)
Hemoglobin: 12.6 g/dL (ref 12.0–15.0)
Immature Granulocytes: 2 %
Lymphocytes Relative: 4 %
Lymphs Abs: 0.8 10*3/uL (ref 0.7–4.0)
MCH: 29.4 pg (ref 26.0–34.0)
MCHC: 31.7 g/dL (ref 30.0–36.0)
MCV: 93 fL (ref 80.0–100.0)
MONO ABS: 0.9 10*3/uL (ref 0.1–1.0)
MONOS PCT: 5 %
NEUTROS ABS: 17.3 10*3/uL — AB (ref 1.7–7.7)
Neutrophils Relative %: 88 %
PLATELETS: 265 10*3/uL (ref 150–400)
RBC: 4.28 MIL/uL (ref 3.87–5.11)
RDW: 15.3 % (ref 11.5–15.5)
WBC: 19.8 10*3/uL — AB (ref 4.0–10.5)
nRBC: 0.2 % (ref 0.0–0.2)

## 2018-02-16 LAB — GLUCOSE, CAPILLARY
GLUCOSE-CAPILLARY: 158 mg/dL — AB (ref 70–99)
GLUCOSE-CAPILLARY: 179 mg/dL — AB (ref 70–99)
Glucose-Capillary: 138 mg/dL — ABNORMAL HIGH (ref 70–99)
Glucose-Capillary: 124 mg/dL — ABNORMAL HIGH (ref 70–99)
Glucose-Capillary: 139 mg/dL — ABNORMAL HIGH (ref 70–99)

## 2018-02-16 LAB — BASIC METABOLIC PANEL
Anion gap: 21 — ABNORMAL HIGH (ref 5–15)
BUN: 107 mg/dL — ABNORMAL HIGH (ref 8–23)
CALCIUM: 8.6 mg/dL — AB (ref 8.9–10.3)
CO2: 20 mmol/L — ABNORMAL LOW (ref 22–32)
CREATININE: 7.48 mg/dL — AB (ref 0.44–1.00)
Chloride: 91 mmol/L — ABNORMAL LOW (ref 98–111)
GFR calc Af Amer: 6 mL/min — ABNORMAL LOW (ref >60)
GFR, EST NON AFRICAN AMERICAN: 5 mL/min — AB (ref >60)
GLUCOSE: 142 mg/dL — AB (ref 70–99)
Potassium: 5.1 mmol/L (ref 3.5–5.1)
SODIUM: 132 mmol/L — AB (ref 135–145)

## 2018-02-16 LAB — PROTIME-INR
INR: 4.29
PROTHROMBIN TIME: 40.5 s — AB (ref 11.4–15.2)

## 2018-02-16 LAB — HEPATIC FUNCTION PANEL
ALBUMIN: 2.5 g/dL — AB (ref 3.5–5.0)
ALT: 13 U/L (ref 0–44)
AST: 20 U/L (ref 15–41)
Alkaline Phosphatase: 95 U/L (ref 38–126)
Bilirubin, Direct: 1 mg/dL — ABNORMAL HIGH (ref 0.0–0.2)
Indirect Bilirubin: 0.9 mg/dL (ref 0.3–0.9)
Total Bilirubin: 1.9 mg/dL — ABNORMAL HIGH (ref 0.3–1.2)
Total Protein: 6.4 g/dL — ABNORMAL LOW (ref 6.5–8.1)

## 2018-02-16 MED ORDER — METOPROLOL TARTRATE 25 MG PO TABS
37.5000 mg | ORAL_TABLET | Freq: Four times a day (QID) | ORAL | Status: DC
  Administered 2018-02-17 (×2): 37.5 mg via ORAL
  Filled 2018-02-16 (×2): qty 2

## 2018-02-16 MED ORDER — NEPRO/CARBSTEADY PO LIQD
237.0000 mL | Freq: Two times a day (BID) | ORAL | Status: DC
  Administered 2018-02-16 (×2): 237 mL via ORAL

## 2018-02-16 MED ORDER — ADULT MULTIVITAMIN W/MINERALS CH
1.0000 | ORAL_TABLET | Freq: Every day | ORAL | Status: DC
  Administered 2018-02-16 – 2018-02-17 (×2): 1 via ORAL
  Filled 2018-02-16 (×2): qty 1

## 2018-02-16 MED ORDER — METOPROLOL TARTRATE 25 MG PO TABS
25.0000 mg | ORAL_TABLET | Freq: Once | ORAL | Status: AC
  Administered 2018-02-16: 25 mg via ORAL
  Filled 2018-02-16: qty 1

## 2018-02-16 MED ORDER — METOPROLOL TARTRATE 25 MG PO TABS
37.5000 mg | ORAL_TABLET | Freq: Four times a day (QID) | ORAL | Status: DC
  Administered 2018-02-16 (×2): 37.5 mg via ORAL
  Filled 2018-02-16 (×2): qty 2

## 2018-02-16 NOTE — Progress Notes (Signed)
Progress Note  Patient Name: Crystal Pacheco Date of Encounter: 02/16/2018  Primary Cardiologist: Ida Rogue, MD   Subjective   Patient reported she feels SOB and tachypnea this morning. She denied cardiac c/o chest pain, palpitations, or feeling of racing heart rate with only slightly elevated ventricular rates on telemetry when compared with yesterday's rates 11/26.   She reported urination last night in this morning with a small amount of amber to cola colored urine observed during this morning's exam.   She reported hesitation to ambulate d/t a previous fall during an attempt to get into a chair.   Her only other complaint other than SOB was of general "aches and pains" /myalgias from lying in bed during hospitalization.   I/O Net +1317.58mL; Urine 26mL HR 102-134bpm SBP 117-146 Cr 7.21  7.48 K 4.5  5.1 INR 8.05  4.54 (11/26)  4.29  Inpatient Medications    Scheduled Meds: . aspirin EC  81 mg Oral Daily  . docusate sodium  100 mg Oral BID  . insulin aspart  0-5 Units Subcutaneous QHS  . insulin aspart  0-9 Units Subcutaneous TID WC  . metoprolol tartrate  25 mg Oral Q6H  . rosuvastatin  10 mg Oral q1800   Continuous Infusions: . sodium chloride 50 mL/hr at 02/15/18 2300  . ceFEPime (MAXIPIME) IV Stopped (02/15/18 1448)   PRN Meds: acetaminophen **OR** acetaminophen, ALPRAZolam, bisacodyl, HYDROcodone-acetaminophen, ipratropium-albuterol, ondansetron **OR** ondansetron (ZOFRAN) IV, traZODone   Vital Signs    Vitals:   02/16/18 0124 02/16/18 0124 02/16/18 0302 02/16/18 0628  BP: 123/70 123/70 121/88   Pulse: 100 100 (!) 113   Resp:  17 17   Temp:   97.7 F (36.5 C)   TempSrc:   Oral   SpO2:   92%   Weight:    109.8 kg  Height:        Intake/Output Summary (Last 24 hours) at 02/16/2018 0742 Last data filed at 02/16/2018 0400 Gross per 24 hour  Intake 1391.99 ml  Output 75 ml  Net 1316.99 ml   Filed Weights   02/17/2018 1000 02/15/18 0500 02/16/18  0628  Weight: 108.4 kg 109.5 kg 109.8 kg    Telemetry    IRIR, rates 100-130s this AM - Personally Reviewed  ECG    No new tracings - Personally Reviewed  Physical Exam   GEN: No acute distress.  Lying in bed. At times appearing uncomfortale Neck: JVP elevated to level of the mandible / 12+ Cardiac: IRIR, tachycardic.soft heart sounds. 1/6 systolic murmur. No rubs, or gallops.  Respiratory: coarse breath sounds and crackles at bilateral bases. L worse than R. GI: Obese. Soft, nontender, non-distended  MS: Improving 1-2+ bilateral pitting edema; No deformity. Neuro:  Nonfocal  Psych: Normal affect  Labs    Chemistry Recent Labs  Lab 02/10/18 1218  02/18/2018 0717 02/15/18 0306 02/16/18 0338  NA 132*   < > 130* 132* 132*  K 2.9*   < > 5.0 4.5 5.1  CL 85*   < > 87* 90* 91*  CO2 21   < > 20* 23 20*  GLUCOSE 166*   < > 183* 104* 142*  BUN 47*   < > 101* 99* 107*  CREATININE 3.72*   < > 6.87* 7.21* 7.48*  CALCIUM 8.8   < > 8.8* 8.6* 8.6*  PROT 6.2  --   --   --   --   ALBUMIN 2.9*  --   --   --   --  AST 20  --   --   --   --   ALT 11  --   --   --   --   ALKPHOS 104  --   --   --   --   BILITOT 2.0*  --   --   --   --   GFRNONAA 12*   < > 6* 5* 5*  GFRAA 14*   < > 6* 6* 6*  ANIONGAP  --   --  23* 19* 21*   < > = values in this interval not displayed.     Hematology Recent Labs  Lab 01/30/2018 1022 02/15/18 0306 02/16/18 0338  WBC 18.1* 18.6* 19.8*  RBC 4.17 4.27 4.28  HGB 12.2 12.4 12.6  HCT 38.9 39.5 39.8  MCV 93.3 92.5 93.0  MCH 29.3 29.0 29.4  MCHC 31.4 31.4 31.7  RDW 14.8 15.1 15.3  PLT 247 260 265    Cardiac EnzymesNo results for input(s): TROPONINI in the last 168 hours. No results for input(s): TROPIPOC in the last 168 hours.   BNP Recent Labs  Lab 02/10/18 1218  BNP 2,052.4*     DDimer No results for input(s): DDIMER in the last 168 hours.   Radiology    Ct Chest Wo Contrast  Result Date: 02/15/2018 CLINICAL DATA:  67 year old  female with pleural effusion. Question lung mass or pneumonia. Acute renal insufficiency. Subsequent encounter. EXAM: CT CHEST WITHOUT CONTRAST TECHNIQUE: Multidetector CT imaging of the chest was performed following the standard protocol without IV contrast. COMPARISON:  01/25/2018 chest x-ray. FINDINGS: Cardiovascular: Mild cardiomegaly. Prominent coronary artery calcification. Aortic valve calcification. Atherosclerotic changes thoracic aorta. Ascending thoracic aorta measures up to 3.7 cm. Prominent size pulmonary arteries. Cannot exclude component of pulmonary hypertension. Mediastinum/Nodes: Subcarinal adenopathy measuring up to 1.6 cm short axis. Lungs/Pleura: Consolidation left upper and left lower lobe with moderate-size left-sided pleural effusion. Consolidation right lower lobe with small pleural effusion. Majority of bronchi are patent with the exception of distal left lower lobe bronchi. Right upper lobe 5 mm nodule (series 6, image 75 and series 3, image 62). Upper Abdomen: Irregular contour of the liver with findings raising possibility of multiple liver lesions. Dilated gallbladder with 2 cm stone. Atrophic right kidney. Question left renal cyst. Musculoskeletal: Kyphoscoliosis thoracic spine without osseous destructive lesion. IMPRESSION: 1. Consolidation left upper and left lower lobe with moderate-size left-sided pleural effusion. Consolidation right lower lobe with small pleural effusion. Majority of bronchi are patent with the exception of distal left lower lobe bronchi. Findings may reflect infectious process given the bilateral lung involvement. 2. Recommend aggressively treating any clinically suspected infectious process with close follow-up chest and abdomen CT (preferably with contrast if patient's renal function improves), as it is difficult to exclude a left hilar mass. Additional finding of right upper lobe 5 mm nodule (series 6, image 75 and series 3, image 62) and possibility of  subtle multiple liver lesions suggests that malignancy is also a consideration. 3. Subcarinal adenopathy with short axis dimension 1.6 cm. 4. Mild cardiomegaly.  Prominent coronary artery calcification. 5. Aortic Atherosclerosis (ICD10-I70.0). Ascending thoracic aorta measures up to 3.7 cm. 6. Prominent size pulmonary arteries. Cannot exclude component of pulmonary hypertension. 7. Dilated gallbladder 2 cm gallstone. 8. Atrophic right kidney. Electronically Signed   By: Genia Del M.D.   On: 02/15/2018 16:11   US Renal  Result Date: 02/06/2018 CLINICAL DATA:  Initial evaluation for acute renal failure. EXAM: RENAL / URINARY TRACT ULTRASOUND  COMPLETE COMPARISON:  None. FINDINGS: Right Kidney: Renal measurements: 5.6 x 2.4 x 1.7 cm = volume: 12 mL. Diffusely increased echogenicity with chronic cortical thinning. No hydronephrosis. 8 x 6 x 9 mm simple cyst present at the interpolar region. Additional 9 x 6 x 7 mm cyst noted at the mid-lower pole. No mass or hydronephrosis visualized. Left Kidney: Renal measurements: 11.0 x 6.5 x 6.7 cm = volume: 250 mL. Echogenicity within normal limits. No mass or hydronephrosis visualized. Bladder: Appears normal for degree of bladder distention. Incidental note made of a 3.2 cm shadowing stone within the gallbladder lumen. Probable associated sludge noted. 1.4 x 1.3 x 1.6 cm hypoechoic avascular lesion within the right hepatic lobe noted, possibly a small cyst. Finding of doubtful significance. IMPRESSION: 1. Asymmetric right renal atrophy.  No hydronephrosis. 2. Subcentimeter simple right renal cysts as above. 3. Cholelithiasis with gallbladder sludging. Electronically Signed   By: Jeannine Boga M.D.   On: 02/08/2018 14:26   Dg Chest Port 1 View  Result Date: 02/16/2018 CLINICAL DATA:  Atelectasis EXAM: PORTABLE CHEST 1 VIEW COMPARISON:  Chest CT from yesterday FINDINGS: Dense airspace disease on the left with layering pleural effusion. Small pleural effusion on  the right where there is also lower lobe airspace disease by CT. Cardiomegaly. No pneumothorax. IMPRESSION: Left more than right consolidation and pleural effusion that is stable to progressed from CT yesterday. CT appearance suggests pneumonia. Electronically Signed   By: Monte Fantasia M.D.   On: 02/16/2018 06:15   Dg Chest Port 1 View  Result Date: 01/27/2018 CLINICAL DATA:  67 year old female with suspected congestive heart failure. EXAM: PORTABLE CHEST 1 VIEW COMPARISON:  Chest radiograph 09/11/2011 and earlier. FINDINGS: AP upright view at 1428 hours. Asymmetric left greater than right lung opacity appears related to a combination of moderate size left pleural effusion and left perihilar airspace opacity. Small if any right pleural effusion. The right lung pulmonary vasculature has a more normal appearance. The left hilum is indistinct. The other mediastinal contours are stable since 2013. Visualized tracheal air column is within normal limits. No pneumothorax. Paucity bowel gas in the upper abdomen. IMPRESSION: Combined left pleural effusion and left lung airspace opacity might reflect CHF with asymmetric pulmonary edema, but a left lung Pneumonia and/or left hilar Obstructing Mass are not excluded. Recommend follow-up radiographs versus further characterization now with chest CT (IV contrast preferred). Electronically Signed   By: Genevie Ann M.D.   On: 02/17/2018 14:42    Cardiac Studies   02/07/2018 TTE Study Conclusions - Left ventricle: The cavity size was normal. Wall thickness was normal. Systolic function was mildly reduced. The estimated ejection fraction was in the range of 45% to 50%. - Aortic valve: Valve area (VTI): 0.98 cm^2. Valve area (Vmax): 1.54 cm^2. Valve area (Vmean): 1.37 cm^2. - Mitral valve: There was mild to moderate regurgitation.  Patient Profile     67 y.o. female with a h/o Afib with RVR, CAD, chronic combined systolic and diastolic heart failure, HTN,  HLD, remote tobacco abuse, COPD, prior CVA, and DM2 seen for the management of Afib with RVR s/p TEE/DCCV cancellation d/t renal failure.  Assessment & Plan    Afib with RVR - Anticoagulation / heparin held d/t renal insufficiency and supra- therapuetic INR at admission on home Xarelto, likely d/t accumulation 2/2 ARF.  - Continue to hold heparin. Monitor INR daily with plan to restart heparin once INR normalized and to maintain therapeutic levels.  - INR 8.05  4.54  4.29.    - CHA2DS2VASc score of at least  8. Plan for rate control only at this time.   - Increase metoprolol tartrate to 37.5mg  po q6h, given continued slightly elevated ventricular rates this AM on 11/27. BP borderline soft at 118/81 today. Continue to monitor vitals. Titrate BB for HR less than 110 bpm given mildly reduced EF on echo above and as BP allows to avoid hypotension. - Recommend TEE/ DCCV if possible or DCCV s/p 3 weeks anticoagulation as an outpatient   ARF  - Likely ATN from overdiuresis - Daily BMET. Renal failure with Cr 7.21 (baseline 0.97)  7.48; K 4.5  5.1. Continue to monitor potassium/electrolytes and treat hyperkalemia if needed. Continue to monitor renal function/electrolytes. - Continue to hold nephrotoxins as above. Avoid contrast procedures. - Stop IVF. Trivial urine output / anuria. Patient does not appear dehydrated but exhibits s/sx concerning for volume overload with known HF. May need to consider HD in the future. - Renal ultrasound performed showed R renal atrophy without hydronephrosis.  - Per nephrology and per recent documentation no acute indication for HD at this time.  CAD - Chest pain free - Previous PCI to OM 2006, known chronically occluded RCA - CT 11/26 showed mild cardiomegaly, prominent CAC, aortic atherosclerosis. Ascending thoracic aorta 3.7cm. - Multiple comorbidities including Afib, tobacco use, HLD, DM2, COPD, recently diagnosed CHF as below. - Continue medical therapy with  statin, ASA, and BB as renal function and vitals permit. Recommend continue to hold nephrotoxic drugs and ACE/ARB/Spiro given Cr and K.  Chronic combined systolic and diastolic heart failure - SOB reported today and suspect multifactorial given recent CXR and CT suggesting L>R consolidation, pna, and b/l pleural effusions in setting of Afib with RVR, acute renal failure / multiorgan dysfunction, COPD, combined systolic and diastolic HF. See CT and CXR from 11/26 and 11/27 - TTE as above with EF 45-50% - echo in CV studies - Continue to hold all diuretics, IVF  PNA, pulmonary infection, possible malignancy - CXR and CT as above. Incidental RUL 55mm nodule (and liver lesions) suggestive of malignancy. Recommendation for treatment and follow-up images (with consideration of renal function if contrast)  - Per IM, critical care  HTN - Borderline soft BP with SBP 110s.  - Stop IVF. Metoprolol as above. Avoid hypotension as will worsen above ARF  DM2 - Continue to hold metformin  For questions or updates, please contact [place] Please consult [email or web address] for contact info under      Signed, Arvil Chaco, PA-C  02/16/2018, 7:42 AM

## 2018-02-16 NOTE — Progress Notes (Signed)
Pt has had no urine output since last night. Bladder scanner yielded 75 cc. Pt does not feel the urge to void. Dr. Candiss Norse and Dr. Manuella Ghazi notified. No new orders received. Will continue to monitor closely.

## 2018-02-16 NOTE — Progress Notes (Signed)
[facility] [place] NAME: Crystal Pacheco    MR#:  [id number]  DATE OF BIRTH:  10-14-1950  SUBJECTIVE:  CHIEF COMPLAINT:  No chief complaint on file. denies any symptoms. BUN/creatinine continues to go up, Tachycardic, making Urine  REVIEW OF SYSTEMS:  Review of Systems  Constitutional: Negative for diaphoresis, fever, malaise/fatigue and weight loss.  HENT: Negative for ear discharge, ear pain, hearing loss, nosebleeds, sore throat and tinnitus.   Eyes: Negative for blurred vision and pain.  Respiratory: Negative for cough, hemoptysis, shortness of breath and wheezing.   Cardiovascular: Negative for chest pain, palpitations, orthopnea and leg swelling.  Gastrointestinal: Negative for abdominal pain, blood in stool, constipation, diarrhea, heartburn, nausea and vomiting.  Genitourinary: Negative for dysuria, frequency and urgency.  Musculoskeletal: Negative for back pain and myalgias.  Skin: Negative for itching and rash.  Neurological: Negative for dizziness, tingling, tremors, focal weakness, seizures, weakness and headaches.  Psychiatric/Behavioral: Negative for depression. The patient is not nervous/anxious.    DRUG ALLERGIES:  No Known Allergies VITALS:  Blood pressure 118/81, pulse (!) 127, temperature 97.6 F (36.4 C), temperature source Oral, resp. rate 18, height 5\' 4"  (1.626 m), weight 109.8 kg, SpO2 94 %. PHYSICAL EXAMINATION:  Physical Exam  Constitutional: She is oriented to person, place, and time.  HENT:  Head: Normocephalic and atraumatic.  Eyes: Pupils are equal, round, and reactive to light. Conjunctivae and EOM are normal.  Neck: Normal range of motion. Neck supple. No tracheal deviation present. No thyromegaly present.  Cardiovascular: Normal rate, regular rhythm and normal heart sounds.  Pulmonary/Chest: Effort normal and breath sounds normal. No respiratory distress. She has no wheezes. She exhibits no tenderness.   Abdominal: Soft. Bowel sounds are normal. She exhibits no distension. There is no tenderness.  Musculoskeletal: Normal range of motion.  Neurological: She is alert and oriented to person, place, and time. No cranial nerve deficit.  Skin: Skin is warm and dry. No rash noted.   LABORATORY PANEL:  Female CBC Recent Labs  Lab 02/16/18 0338  WBC 19.8*  HGB 12.6  HCT 39.8  PLT 265   ------------------------------------------------------------------------------------------------------------------ Chemistries  Recent Labs  Lab 02/10/18 1218  02/15/18 0306 02/16/18 0338  NA 132*   < > 132* 132*  K 2.9*   < > 4.5 5.1  CL 85*   < > 90* 91*  CO2 21   < > 23 20*  GLUCOSE 166*   < > 104* 142*  BUN 47*   < > 99* 107*  CREATININE 3.72*   < > 7.21* 7.48*  CALCIUM 8.8   < > 8.6* 8.6*  MG 1.8  --  2.1  --   AST 20  --   --   --   ALT 11  --   --   --   ALKPHOS 104  --   --   --   BILITOT 2.0*  --   --   --    < > = values in this interval not displayed.   RADIOLOGY:  Ct Chest Wo Contrast  Result Date: 02/15/2018 CLINICAL DATA:  67 year old female with pleural effusion. Question lung mass or pneumonia. Acute renal insufficiency. Subsequent encounter. EXAM: CT CHEST WITHOUT CONTRAST TECHNIQUE: Multidetector CT imaging of the chest was performed following the standard protocol without IV contrast. COMPARISON:  02/11/2018 chest x-ray. FINDINGS: Cardiovascular: Mild cardiomegaly. Prominent coronary artery calcification. Aortic valve calcification. Atherosclerotic changes thoracic aorta. Ascending thoracic aorta  measures up to 3.7 cm. Prominent size pulmonary arteries. Cannot exclude component of pulmonary hypertension. Mediastinum/Nodes: Subcarinal adenopathy measuring up to 1.6 cm short axis. Lungs/Pleura: Consolidation left upper and left lower lobe with moderate-size left-sided pleural effusion. Consolidation right lower lobe with small pleural effusion. Majority of bronchi are patent with the  exception of distal left lower lobe bronchi. Right upper lobe 5 mm nodule (series 6, image 75 and series 3, image 62). Upper Abdomen: Irregular contour of the liver with findings raising possibility of multiple liver lesions. Dilated gallbladder with 2 cm stone. Atrophic right kidney. Question left renal cyst. Musculoskeletal: Kyphoscoliosis thoracic spine without osseous destructive lesion. IMPRESSION: 1. Consolidation left upper and left lower lobe with moderate-size left-sided pleural effusion. Consolidation right lower lobe with small pleural effusion. Majority of bronchi are patent with the exception of distal left lower lobe bronchi. Findings may reflect infectious process given the bilateral lung involvement. 2. Recommend aggressively treating any clinically suspected infectious process with close follow-up chest and abdomen CT (preferably with contrast if patient's renal function improves), as it is difficult to exclude a left hilar mass. Additional finding of right upper lobe 5 mm nodule (series 6, image 75 and series 3, image 62) and possibility of subtle multiple liver lesions suggests that malignancy is also a consideration. 3. Subcarinal adenopathy with short axis dimension 1.6 cm. 4. Mild cardiomegaly.  Prominent coronary artery calcification. 5. Aortic Atherosclerosis (ICD10-I70.0). Ascending thoracic aorta measures up to 3.7 cm. 6. Prominent size pulmonary arteries. Cannot exclude component of pulmonary hypertension. 7. Dilated gallbladder 2 cm gallstone. 8. Atrophic right kidney. Electronically Signed   By: Genia Del M.D.   On: 02/15/2018 16:11   Dg Chest Port 1 View  Result Date: 02/16/2018 CLINICAL DATA:  Atelectasis EXAM: PORTABLE CHEST 1 VIEW COMPARISON:  Chest CT from yesterday FINDINGS: Dense airspace disease on the left with layering pleural effusion. Small pleural effusion on the right where there is also lower lobe airspace disease by CT. Cardiomegaly. No pneumothorax. IMPRESSION:  Left more than right consolidation and pleural effusion that is stable to progressed from CT yesterday. CT appearance suggests pneumonia. Electronically Signed   By: Monte Fantasia M.D.   On: 02/16/2018 06:15   ASSESSMENT AND PLAN:  30 y f with ARF  * ARF: creat 7.1->7.4 - likely ATN - Nephro following - hold Diuretics and any nephrotoxic meds - may need HD  * A.fib with RVR - Increased Metoprolol 37.5 Q 6 hrs - Hold Xarelto. INR 4.29 - Cardio following  * Chronic combined systolic and diastolic heart failure: EF 45-50% - well compensated at this time  * DM - SSI for now, hold metformin     All the records are reviewed and case discussed with Care Management/Social Worker. Management plans discussed with the patient, family and they are in agreement.  CODE STATUS: Full Code  TOTAL TIME TAKING CARE OF THIS PATIENT: 25 minutes.   More than 50% of the time was spent in counseling/coordination of care: YES  POSSIBLE D/C IN 3-4 DAYS, DEPENDING ON CLINICAL CONDITION. And Cardiac, Nephro eval   Max Sane M.D on 02/16/2018 at 10:09 AM  Between 7am to 6pm - Pager - [phone]  After 6pm go to [email or web address] - Proofreader  [facility]  Office  [phone]  CC: Primary care physician; Birdie Sons, MD  Note: This dictation was prepared with Dragon dictation along with smaller phrase technology. Any transcriptional errors that result from this process  are unintentional.

## 2018-02-16 NOTE — Plan of Care (Signed)
  Problem: Education: Goal: Knowledge of General Education information will improve Description Including pain rating scale, medication(s)/side effects and non-pharmacologic comfort measures Outcome: Progressing   Problem: Health Behavior/Discharge Planning: Goal: Ability to manage health-related needs will improve Outcome: Progressing   Problem: Clinical Measurements: Goal: Ability to maintain clinical measurements within normal limits will improve Outcome: Progressing   Problem: Safety: Goal: Ability to remain free from injury will improve Outcome: Progressing   Problem: Activity: Goal: Activity intolerance will improve Outcome: Progressing

## 2018-02-16 NOTE — Progress Notes (Signed)
Initial Nutrition Assessment  DOCUMENTATION CODES:   Obesity unspecified  INTERVENTION:  Nepro Shake po BID, each supplement provides 425 kcal and 19 grams protein (pt requested to try chocolate) MVI   NUTRITION DIAGNOSIS:   Inadequate oral intake related to acute illness, nausea, decreased appetite as evidenced by energy intake < 75% for > 7 days, meal completion < 50%.   GOAL:   Patient will meet greater than or equal to 90% of their needs  MONITOR:   PO intake, Supplement acceptance, I & O's, Labs, Skin, Weight trends  REASON FOR ASSESSMENT:   Malnutrition Screening Tool    ASSESSMENT:  67 year old patient with known history of CHF, COPD, CAD, Coronary angioplasty,MI, T2DM, HTN, MI, Afib, cerebral infarction,  admitted with acute renal failure.    Patient was seen 11/26 by nephrology who reports BUN/Cr continuing to increase 99/7.2 and phosphorus at 7.1 with AKI likely secondary to overdiuresis causing acute tubular necrosis.   Patient awake with husband at bedside during visit. Pt c/o dry mouth and decreased appetite this morning. Patient stated she did not order breakfast, but plans on having milk, peaches, and applesauce for lunch. Pt reports nausea x 2 weeks w/o vomiting stating she would attempt to eat and found that when food would hit the back of her throat she felt she was going to get sick.   Pt endorses 75lb wt loss in 2 months which she relates to fluid loss and is typical for her to have large gains/losses in a short amount of time.   Pt receptive to Nepro during admission and having them at home to supplement poor appetite. RD encouraged small meals throughout the day vs 2-3 larger meals that husband reports.   Medications: novolog (0-5 units) at bedtime, (0-9 units) with meals, hydrocodone  Labs: Na 132 (L), Glucose 142 (H), BUN 107 (H) Cr 7.48 (H)  Lab Results  Component Value Date   HGBA1C 7.8 (H) 01/25/2018       NUTRITION - FOCUSED PHYSICAL  EXAM:    Most Recent Value  Orbital Region  Mild depletion  Upper Arm Region  No depletion  Thoracic and Lumbar Region  No depletion  Buccal Region  No depletion  Temple Region  Mild depletion  Clavicle Bone Region  No depletion  Clavicle and Acromion Bone Region  No depletion  Scapular Bone Region  No depletion  Dorsal Hand  Mild depletion  Patellar Region  Unable to assess  Anterior Thigh Region  Unable to assess  Posterior Calf Region  Unable to assess  Edema (RD Assessment)  Severe [+4 BLE]  Hair  Reviewed  Eyes  Reviewed  Mouth  Reviewed [Dry]  Skin  Reviewed  Nails  Reviewed       Diet Order:  40% of 1 recorded meal Diet Order            Diet renal/carb modified with fluid restriction Diet-HS Snack? Nothing; Fluid restriction: 1200 mL Fluid; Room service appropriate? Yes; Fluid consistency: Thin  Diet effective now              EDUCATION NEEDS:   No education needs have been identified at this time  Skin:  Skin Assessment: Reviewed RN Assessment(ecchymosis; right/left hand)  Last BM:  02/12/2018  Height:   Ht Readings from Last 1 Encounters:  02/15/2018 5\' 4"  (1.626 m)    Weight:   Wt Readings from Last 1 Encounters:  02/16/18 109.8 kg    Ideal Body Weight:  54.5 kg  BMI:  Body mass index is 41.54 kg/m.  Estimated Nutritional Needs:   Kcal:  1852-2006 (MSJ 1.2-1.3)(Based on 02/10/18 wt 102.4kg)  Protein:  44-60 grams (0.8-1.1g/kg IBW)  Fluid:  1.2L/day per MD    Lajuan Lines, RD, LDN  After Hours/Weekend Pager: [phone]

## 2018-02-16 NOTE — Care Management Note (Addendum)
Case Management Note  Patient Details  Name: Crystal Pacheco MRN: [id number] Date of Birth: [birth year]  Subjective/Objective:     Patient is from home with husband and daughter.  Direct admit for ARF.  Recent history of CHF, COPD per patient.  Patient was seen by PCP for edema and sob.  Over the last couple of weeks she has had increased difficulty ambulating.  She uses a walker but can only walk a few feet before tiring out.  States she was recently started on Xarelto.  She was given samples by her PCP.  Never used the 30 day free coupon.  Provided husband with coupon.  She gets her prescriptions at [facility].  Declines issues obtaining medications or with medical care.  Her PCP is DR. Fisher.  RNCM asked if she has ever used home health services in the past; she has not.  She is not interested at this time about talking about possible home health agencies if indicated.  She has a functioning scale at home and explained importance of weighing daily with CHF diagnoses.   She states she does not need home health.  Currently on room air; family at bedside.  Will continue to follow as patient progresses.      Action/Plan:   Expected Discharge Date:                  Expected Discharge Plan:  Home/Self Care  In-House Referral:     Discharge planning Services     Post Acute Care Choice:    Choice offered to:     DME Arranged:    DME Agency:     HH Arranged:    HH Agency:     Status of Service:  In process, will continue to follow  If discussed at Long Length of Stay Meetings, dates discussed:    Additional Comments:  Elza Rafter, RN 02/16/2018, 4:03 PM

## 2018-02-16 NOTE — Progress Notes (Signed)
[place], [place] 02/16/18  Subjective:   Patient remains critically ill.  BUN/creatinine are further increased to 107/7.5 Phosphorus is higher at 7.1 Urine output remains minimal Patient denies any shortness of breath.  She is trying to eat a little bit Appetite is poor  Objective:  Vital signs in last 24 hours:  Temp:  [97.3 F (36.3 C)-98.2 F (36.8 C)] 97.6 F (36.4 C) (11/27 0838) Pulse Rate:  [98-132] 127 (11/27 0838) Resp:  [17-25] 18 (11/27 0838) BP: (118-146)/(70-119) 118/81 (11/27 0838) SpO2:  [90 %-95 %] 94 % (11/27 0838) Weight:  [109.8 kg] 109.8 kg (11/27 0628)  Weight change: 9.979 kg Filed Weights   02/07/2018 1000 02/15/18 0500 02/16/18 0628  Weight: 108.4 kg 109.5 kg 109.8 kg    Intake/Output:    Intake/Output Summary (Last 24 hours) at 02/16/2018 1214 Last data filed at 02/16/2018 0400 Gross per 24 hour  Intake 679.55 ml  Output 75 ml  Net 604.55 ml    Physical Exam: General:  Morbidly obese lady, lying in the bed  HEENT  anicteric, moist oral mucous membranes  Neck:  Short, thick, supple  Lungs:  Limited exam, mild crackles, room air  Heart::  Tachycardic, irregular, atrial fibrillation  Abdomen:  Soft, distended  Extremities:  2+ edema  Neurologic:  Alert, able to answer questions  Skin:  Dry skin      Basic Metabolic Panel:  Recent Labs  Lab 02/10/18 1218 02/10/18 1520 01/25/2018 0717 02/15/18 0306 02/16/18 0338  NA 132* 131* 130* 132* 132*  K 2.9* 3.0* 5.0 4.5 5.1  CL 85* 83* 87* 90* 91*  CO2 21 21 20* 23 20*  GLUCOSE 166* 134* 183* 104* 142*  BUN 47* 49* 101* 99* 107*  CREATININE 3.72* 3.46* 6.87* 7.21* 7.48*  CALCIUM 8.8 8.5* 8.8* 8.6* 8.6*  MG 1.8  --   --  2.1  --   PHOS  --   --   --  7.1*  --      CBC: Recent Labs  Lab 02/10/18 1520 01/26/2018 1022 02/15/18 0306 02/16/18 0338  WBC 18.4* 18.1* 18.6* 19.8*  NEUTROABS 16.2*  --   --  17.3*  HGB 11.7 12.2 12.4 12.6  HCT 34.5 38.9  39.5 39.8  MCV 89 93.3 92.5 93.0  PLT 220 247 260 265     No results found for: HEPBSAG, HEPBSAB, HEPBIGM    Microbiology:  Recent Results (from the past 240 hour(s))  MRSA PCR Screening     Status: None   Collection Time: 02/12/2018 10:06 AM  Result Value Ref Range Status   MRSA by PCR NEGATIVE NEGATIVE Final    Comment:        The GeneXpert MRSA Assay (FDA approved for NASAL specimens only), is one component of a comprehensive MRSA colonization surveillance program. It is not intended to diagnose MRSA infection nor to guide or monitor treatment for MRSA infections. Performed at [facility], [address] [place], [place] [address]     Coagulation Studies: Recent Labs    02/12/2018 1022 02/15/18 0855 02/16/18 0822  LABPROT 66.0* 42.3* 40.5*  INR 8.05* 4.54* 4.29*    Urinalysis: Recent Labs    02/04/2018 1646  COLORURINE AMBER*  LABSPEC 1.013  PHURINE 5.0  GLUCOSEU NEGATIVE  HGBUR NEGATIVE  BILIRUBINUR NEGATIVE  KETONESUR NEGATIVE  PROTEINUR NEGATIVE  NITRITE NEGATIVE  LEUKOCYTESUR TRACE*      Imaging: Ct Chest Wo Contrast  Result Date: 02/15/2018 CLINICAL DATA:  67 year old female with pleural effusion. Question lung mass or pneumonia. Acute renal insufficiency. Subsequent encounter. EXAM: CT CHEST WITHOUT CONTRAST TECHNIQUE: Multidetector CT imaging of the chest was performed following the standard protocol without IV contrast. COMPARISON:  01/25/2018 chest x-ray. FINDINGS: Cardiovascular: Mild cardiomegaly. Prominent coronary artery calcification. Aortic valve calcification. Atherosclerotic changes thoracic aorta. Ascending thoracic aorta measures up to 3.7 cm. Prominent size pulmonary arteries. Cannot exclude component of pulmonary hypertension. Mediastinum/Nodes: Subcarinal adenopathy measuring up to 1.6 cm short axis. Lungs/Pleura: Consolidation left upper and left lower lobe with moderate-size left-sided pleural effusion. Consolidation right  lower lobe with small pleural effusion. Majority of bronchi are patent with the exception of distal left lower lobe bronchi. Right upper lobe 5 mm nodule (series 6, image 75 and series 3, image 62). Upper Abdomen: Irregular contour of the liver with findings raising possibility of multiple liver lesions. Dilated gallbladder with 2 cm stone. Atrophic right kidney. Question left renal cyst. Musculoskeletal: Kyphoscoliosis thoracic spine without osseous destructive lesion. IMPRESSION: 1. Consolidation left upper and left lower lobe with moderate-size left-sided pleural effusion. Consolidation right lower lobe with small pleural effusion. Majority of bronchi are patent with the exception of distal left lower lobe bronchi. Findings may reflect infectious process given the bilateral lung involvement. 2. Recommend aggressively treating any clinically suspected infectious process with close follow-up chest and abdomen CT (preferably with contrast if patient's renal function improves), as it is difficult to exclude a left hilar mass. Additional finding of right upper lobe 5 mm nodule (series 6, image 75 and series 3, image 62) and possibility of subtle multiple liver lesions suggests that malignancy is also a consideration. 3. Subcarinal adenopathy with short axis dimension 1.6 cm. 4. Mild cardiomegaly.  Prominent coronary artery calcification. 5. Aortic Atherosclerosis (ICD10-I70.0). Ascending thoracic aorta measures up to 3.7 cm. 6. Prominent size pulmonary arteries. Cannot exclude component of pulmonary hypertension. 7. Dilated gallbladder 2 cm gallstone. 8. Atrophic right kidney. Electronically Signed   By: Genia Del M.D.   On: 02/15/2018 16:11   US Renal  Result Date: 02/04/2018 CLINICAL DATA:  Initial evaluation for acute renal failure. EXAM: RENAL / URINARY TRACT ULTRASOUND COMPLETE COMPARISON:  None. FINDINGS: Right Kidney: Renal measurements: 5.6 x 2.4 x 1.7 cm = volume: 12 mL. Diffusely increased  echogenicity with chronic cortical thinning. No hydronephrosis. 8 x 6 x 9 mm simple cyst present at the interpolar region. Additional 9 x 6 x 7 mm cyst noted at the mid-lower pole. No mass or hydronephrosis visualized. Left Kidney: Renal measurements: 11.0 x 6.5 x 6.7 cm = volume: 250 mL. Echogenicity within normal limits. No mass or hydronephrosis visualized. Bladder: Appears normal for degree of bladder distention. Incidental note made of a 3.2 cm shadowing stone within the gallbladder lumen. Probable associated sludge noted. 1.4 x 1.3 x 1.6 cm hypoechoic avascular lesion within the right hepatic lobe noted, possibly a small cyst. Finding of doubtful significance. IMPRESSION: 1. Asymmetric right renal atrophy.  No hydronephrosis. 2. Subcentimeter simple right renal cysts as above. 3. Cholelithiasis with gallbladder sludging. Electronically Signed   By: Jeannine Boga M.D.   On: 02/03/2018 14:26   Dg Chest Port 1 View  Result Date: 02/16/2018 CLINICAL DATA:  Atelectasis EXAM: PORTABLE CHEST 1 VIEW COMPARISON:  Chest CT from yesterday FINDINGS: Dense airspace disease on the left with layering pleural effusion. Small pleural effusion on the right where there is also lower lobe airspace disease by CT. Cardiomegaly. No pneumothorax. IMPRESSION: Left more than right consolidation and  pleural effusion that is stable to progressed from CT yesterday. CT appearance suggests pneumonia. Electronically Signed   By: Monte Fantasia M.D.   On: 02/16/2018 06:15   Dg Chest Port 1 View  Result Date: 02/01/2018 CLINICAL DATA:  67 year old female with suspected congestive heart failure. EXAM: PORTABLE CHEST 1 VIEW COMPARISON:  Chest radiograph 09/11/2011 and earlier. FINDINGS: AP upright view at 1428 hours. Asymmetric left greater than right lung opacity appears related to a combination of moderate size left pleural effusion and left perihilar airspace opacity. Small if any right pleural effusion. The right lung  pulmonary vasculature has a more normal appearance. The left hilum is indistinct. The other mediastinal contours are stable since 2013. Visualized tracheal air column is within normal limits. No pneumothorax. Paucity bowel gas in the upper abdomen. IMPRESSION: Combined left pleural effusion and left lung airspace opacity might reflect CHF with asymmetric pulmonary edema, but a left lung Pneumonia and/or left hilar Obstructing Mass are not excluded. Recommend follow-up radiographs versus further characterization now with chest CT (IV contrast preferred). Electronically Signed   By: Genevie Ann M.D.   On: 01/26/2018 14:42     Medications:   . ceFEPime (MAXIPIME) IV Stopped (02/15/18 1448)   . aspirin EC  81 mg Oral Daily  . docusate sodium  100 mg Oral BID  . feeding supplement (NEPRO CARB STEADY)  237 mL Oral BID BM  . insulin aspart  0-5 Units Subcutaneous QHS  . insulin aspart  0-9 Units Subcutaneous TID WC  . metoprolol tartrate  37.5 mg Oral Q6H  . multivitamin with minerals  1 tablet Oral Daily  . rosuvastatin  10 mg Oral q1800   acetaminophen **OR** acetaminophen, ALPRAZolam, bisacodyl, HYDROcodone-acetaminophen, ipratropium-albuterol, ondansetron **OR** ondansetron (ZOFRAN) IV, traZODone  Assessment/ Plan:  67 y.o. female with  medical problems of chronic systolic CHF, COPD, coronary disease with history of angioplasty, hypertension, atrial fibrillation, diabetes type 2, non-insulin-dependent, who was admitted to [facility] on 01/29/2018  2D echo November 18: LVEF 45 to 50%, moderate mitral regurgitation  1.  Acute kidney injury, baseline creatinine 0.97 from January 31, 2018 2.  Generalized edema 3.  Atrial fibrillation  Acute kidney injury is likely secondary to overdiuresis causing ATN Urine output remains minimal  Plan: -Maintain hemodynamics to avoid hypotension -Hold further diuretic administration -Renal ultrasound suggests right renal atrophy.  No hydronephrosis. Electrolytes  and volume status are acceptable.  Patient probably has mild uremic symptoms.  We will continue to monitor closely.  No acute indication for dialysis We will follow closely    LOS: 2 Nasser Ku Candiss Norse 11/27/201912:14 PM  [place], [place]  Note: This note was prepared with Dragon dictation. Any transcription errors are unintentional

## 2018-02-16 NOTE — Progress Notes (Signed)
CRITICAL VALUE ALERT  Critical Value:  INR 4.29  Date & Time Notied:  02/16/18.0930  Provider Notified: Dr. Manuella Ghazi  Orders Received/Actions taken: no new orders

## 2018-02-16 NOTE — Progress Notes (Signed)
Patient BP 98/78 HR 96 so 2000 dose of metoprolol held. Patient BP 108/89 on recheck and HR 105-120s. MD Jannifer Franklin consulted about metoprolol administration. Per MD, give 25 mg of Metoprolol now and restart Metoprolol 37.5 mg in AM. Will administer as ordered and continue to monitor.   Iran Sizer M

## 2018-02-17 ENCOUNTER — Inpatient Hospital Stay: Payer: Medicare Other

## 2018-02-17 DIAGNOSIS — N170 Acute kidney failure with tubular necrosis: Principal | ICD-10-CM

## 2018-02-17 DIAGNOSIS — I482 Chronic atrial fibrillation, unspecified: Secondary | ICD-10-CM

## 2018-02-17 DIAGNOSIS — E119 Type 2 diabetes mellitus without complications: Secondary | ICD-10-CM

## 2018-02-17 LAB — COMPREHENSIVE METABOLIC PANEL
ALT: 31 U/L (ref 0–44)
ANION GAP: 24 — AB (ref 5–15)
AST: 125 U/L — ABNORMAL HIGH (ref 15–41)
Albumin: 2.7 g/dL — ABNORMAL LOW (ref 3.5–5.0)
Alkaline Phosphatase: 203 U/L — ABNORMAL HIGH (ref 38–126)
BUN: 137 mg/dL — ABNORMAL HIGH (ref 8–23)
CO2: 18 mmol/L — ABNORMAL LOW (ref 22–32)
Calcium: 9.3 mg/dL (ref 8.9–10.3)
Chloride: 87 mmol/L — ABNORMAL LOW (ref 98–111)
Creatinine, Ser: 8.86 mg/dL — ABNORMAL HIGH (ref 0.44–1.00)
GFR calc Af Amer: 5 mL/min — ABNORMAL LOW (ref >60)
GFR calc non Af Amer: 4 mL/min — ABNORMAL LOW (ref >60)
GLUCOSE: 163 mg/dL — AB (ref 70–99)
Potassium: 6 mmol/L — ABNORMAL HIGH (ref 3.5–5.1)
Sodium: 129 mmol/L — ABNORMAL LOW (ref 135–145)
Total Bilirubin: 3.1 mg/dL — ABNORMAL HIGH (ref 0.3–1.2)
Total Protein: 7.1 g/dL (ref 6.5–8.1)

## 2018-02-17 LAB — CBC
HEMATOCRIT: 42.2 % (ref 36.0–46.0)
HEMOGLOBIN: 13.3 g/dL (ref 12.0–15.0)
MCH: 29.1 pg (ref 26.0–34.0)
MCHC: 31.5 g/dL (ref 30.0–36.0)
MCV: 92.3 fL (ref 80.0–100.0)
NRBC: 0.3 % — AB (ref 0.0–0.2)
Platelets: 278 10*3/uL (ref 150–400)
RBC: 4.57 MIL/uL (ref 3.87–5.11)
RDW: 15.4 % (ref 11.5–15.5)
WBC: 24.7 10*3/uL — ABNORMAL HIGH (ref 4.0–10.5)

## 2018-02-17 LAB — CBC WITH DIFFERENTIAL/PLATELET
Abs Immature Granulocytes: 0.64 10*3/uL — ABNORMAL HIGH (ref 0.00–0.07)
Basophils Absolute: 0.1 10*3/uL (ref 0.0–0.1)
Basophils Relative: 0 %
Eosinophils Absolute: 0 10*3/uL (ref 0.0–0.5)
Eosinophils Relative: 0 %
HEMATOCRIT: 44 % (ref 36.0–46.0)
Hemoglobin: 13.8 g/dL (ref 12.0–15.0)
Immature Granulocytes: 3 %
Lymphocytes Relative: 3 %
Lymphs Abs: 0.7 10*3/uL (ref 0.7–4.0)
MCH: 29.2 pg (ref 26.0–34.0)
MCHC: 31.4 g/dL (ref 30.0–36.0)
MCV: 93.2 fL (ref 80.0–100.0)
MONO ABS: 1.1 10*3/uL — AB (ref 0.1–1.0)
MONOS PCT: 4 %
NEUTROS ABS: 22 10*3/uL — AB (ref 1.7–7.7)
Neutrophils Relative %: 90 %
Platelets: 304 10*3/uL (ref 150–400)
RBC: 4.72 MIL/uL (ref 3.87–5.11)
RDW: 15.7 % — ABNORMAL HIGH (ref 11.5–15.5)
WBC: 24.6 10*3/uL — ABNORMAL HIGH (ref 4.0–10.5)
nRBC: 1.2 % — ABNORMAL HIGH (ref 0.0–0.2)

## 2018-02-17 LAB — BASIC METABOLIC PANEL
ANION GAP: 23 — AB (ref 5–15)
BUN: 119 mg/dL — ABNORMAL HIGH (ref 8–23)
CHLORIDE: 88 mmol/L — AB (ref 98–111)
CO2: 20 mmol/L — AB (ref 22–32)
Calcium: 9 mg/dL (ref 8.9–10.3)
Creatinine, Ser: 8.21 mg/dL — ABNORMAL HIGH (ref 0.44–1.00)
GFR calc non Af Amer: 5 mL/min — ABNORMAL LOW (ref >60)
GFR, EST AFRICAN AMERICAN: 5 mL/min — AB (ref >60)
GLUCOSE: 169 mg/dL — AB (ref 70–99)
POTASSIUM: 5.4 mmol/L — AB (ref 3.5–5.1)
Sodium: 131 mmol/L — ABNORMAL LOW (ref 135–145)

## 2018-02-17 LAB — PROTIME-INR
INR: 8.96
INR: 9.89
PROTHROMBIN TIME: 71.7 s — AB (ref 11.4–15.2)
PROTHROMBIN TIME: 77.4 s — AB (ref 11.4–15.2)

## 2018-02-17 LAB — LACTATE DEHYDROGENASE: LDH: 520 U/L — AB (ref 98–192)

## 2018-02-17 LAB — GLUCOSE, CAPILLARY
GLUCOSE-CAPILLARY: 185 mg/dL — AB (ref 70–99)
Glucose-Capillary: 163 mg/dL — ABNORMAL HIGH (ref 70–99)
Glucose-Capillary: 162 mg/dL — ABNORMAL HIGH (ref 70–99)

## 2018-02-17 LAB — LACTIC ACID, PLASMA: Lactic Acid, Venous: 4 mmol/L (ref 0.5–1.9)

## 2018-02-17 LAB — FIBRINOGEN: Fibrinogen: 577 mg/dL — ABNORMAL HIGH (ref 210–475)

## 2018-02-17 LAB — FIBRIN DERIVATIVES D-DIMER (ARMC ONLY): Fibrin derivatives D-dimer (ARMC): 5275.8 ng/mL (FEU) — ABNORMAL HIGH (ref 0.00–499.00)

## 2018-02-17 LAB — CALCIUM, IONIZED: CALCIUM, IONIZED, SERUM: 4.4 mg/dL — AB (ref 4.5–5.6)

## 2018-02-17 MED ORDER — ALUM & MAG HYDROXIDE-SIMETH 200-200-20 MG/5ML PO SUSP
30.0000 mL | ORAL | Status: DC | PRN
  Administered 2018-02-17: 30 mL via ORAL
  Filled 2018-02-17 (×2): qty 30

## 2018-02-17 MED ORDER — VITAMIN K1 10 MG/ML IJ SOLN
10.0000 mg | Freq: Once | INTRAMUSCULAR | Status: AC
  Administered 2018-02-17: 10 mg via SUBCUTANEOUS
  Filled 2018-02-17: qty 1

## 2018-02-17 MED ORDER — CHLORHEXIDINE GLUCONATE CLOTH 2 % EX PADS
6.0000 | MEDICATED_PAD | Freq: Every day | CUTANEOUS | Status: DC
  Administered 2018-02-17 – 2018-02-19 (×3): 6 via TOPICAL

## 2018-02-17 MED ORDER — SODIUM CHLORIDE 0.9% IV SOLUTION
Freq: Once | INTRAVENOUS | Status: AC
  Administered 2018-02-17: 18:00:00 via INTRAVENOUS

## 2018-02-17 MED ORDER — SODIUM CHLORIDE 0.9 % IV BOLUS
500.0000 mL | Freq: Once | INTRAVENOUS | Status: AC
  Administered 2018-02-17: 500 mL via INTRAVENOUS

## 2018-02-17 MED ORDER — MIDAZOLAM HCL 2 MG/2ML IJ SOLN: 1.0000 mg | Freq: Once | INTRAMUSCULAR | Status: DC

## 2018-02-17 MED ORDER — VANCOMYCIN HCL IN DEXTROSE 1-5 GM/200ML-% IV SOLN
1000.0000 mg | Freq: Once | INTRAVENOUS | Status: AC
  Administered 2018-02-18: 1000 mg via INTRAVENOUS
  Filled 2018-02-17 (×2): qty 200

## 2018-02-17 MED ORDER — MORPHINE SULFATE (PF) 2 MG/ML IV SOLN: 2.0000 mg | Freq: Once | INTRAVENOUS | Status: DC

## 2018-02-17 MED ORDER — PATIROMER SORBITEX CALCIUM 8.4 G PO PACK
8.4000 g | PACK | Freq: Every day | ORAL | Status: DC
  Filled 2018-02-17 (×2): qty 1

## 2018-02-17 MED ORDER — PROMETHAZINE HCL 25 MG/ML IJ SOLN
12.5000 mg | Freq: Four times a day (QID) | INTRAMUSCULAR | Status: DC | PRN
  Administered 2018-02-17: 12.5 mg via INTRAVENOUS
  Filled 2018-02-17: qty 1

## 2018-02-17 MED ORDER — SODIUM CHLORIDE 0.9 % IV SOLN
INTRAVENOUS | Status: DC
  Administered 2018-02-17 – 2018-02-18 (×2): via INTRAVENOUS

## 2018-02-17 MED ORDER — PROMETHAZINE HCL 25 MG/ML IJ SOLN
12.5000 mg | Freq: Once | INTRAMUSCULAR | Status: AC
  Administered 2018-02-17: 12.5 mg via INTRAVENOUS
  Filled 2018-02-17: qty 1

## 2018-02-17 MED ORDER — FENTANYL CITRATE (PF) 100 MCG/2ML IJ SOLN: 25.0000 ug | Freq: Once | INTRAMUSCULAR | Status: DC

## 2018-02-17 NOTE — Progress Notes (Signed)
CRITICAL VALUE ALERT  Critical Value:  INR 8.96  Date & Time Notied:  02/17/18 1103  Provider Notified: Dr. Estanislado Pandy  Orders Received/Actions taken: Notified. No new orders

## 2018-02-17 NOTE — Consult Note (Signed)
[facility] [place]  Patient name: Crystal Pacheco MRN: [id number] DOB: 03/14/1951 Sex: female   REQUESTING PROVIDER:   Renal   REASON FOR CONSULT:    Dialysis catheter placement  HISTORY OF PRESENT ILLNESS:   Crystal Pacheco is a 67 y.o. female, with worsening renal failure secondary to ATN.  She has been in atrial fibrillation with rapid ventricular response on anticoagulation which is being held.  She has a supratherapeutic INR.  She was given vitamin K this morning.  She is medically managed for type 2 diabetes.  PAST MEDICAL HISTORY    Past Medical History:  Diagnosis Date  . Anemia   . Anxiety   . Cerebral infarction ([place]) 2006  . Chronic combined systolic (congestive) and diastolic (congestive) heart failure ([place])    a. 08/2005 MV: EF 42%; b. 01/2018 Echo: EF 45-50%. Mild to mod MR.  Marland Kitchen COPD (chronic obstructive pulmonary disease) ([place])   . Coronary artery disease 2006   a. 01/2005 PCI: DES->OM. RCA 100p. Complicated by groin PSA; b 08/2005 MV: EF 42%, anterior and high lateral scar w/ peri-inf ischemia.  . Essential hypertension   . GERD (gastroesophageal reflux disease)   . MI (myocardial infarction) ([place]) 2006  . Persistent atrial fibrillation    a. Dx 01/2018. CHA2DS2VASc = 8-->Xarelto.  . Type II diabetes mellitus ([place])      FAMILY HISTORY   Family History  Problem Relation Age of Onset  . CAD Mother        CABG in her 56's    SOCIAL HISTORY:   Social History   Socioeconomic History  . Marital status: Married    Spouse name: Not on file  . Number of children: 1  . Years of education: Not on file  . Highest education level: Not on file  Occupational History  . Occupation: Retired  Scientific laboratory technician  . Financial resource strain: Not on file  . Food insecurity:    Worry: Not on file    Inability: Not on file  . Transportation needs:    Medical: Not on file    Non-medical: Not on file  Tobacco  Use  . Smoking status: Former Smoker    Packs/day: 1.00    Years: 20.00    Pack years: 20.00    Types: Cigarettes    Last attempt to quit: 11/15/2007    Years since quitting: 10.2  . Smokeless tobacco: Never Used  Substance and Sexual Activity  . Alcohol use: Yes    Alcohol/week: 2.0 standard drinks    Types: 2 Standard drinks or equivalent per week    Comment: occasional cocktail.  . Drug use: No  . Sexual activity: Not on file  Lifestyle  . Physical activity:    Days per week: Not on file    Minutes per session: Not on file  . Stress: Not on file  Relationships  . Social connections:    Talks on phone: Not on file    Gets together: Not on file    Attends religious service: Not on file    Active member of club or organization: Not on file    Attends meetings of clubs or organizations: Not on file    Relationship status: Not on file  . Intimate partner violence:    Fear of current or ex partner: Not on file    Emotionally abused: Not on file    Physically abused: Not on file    Forced sexual activity:  Not on file  Other Topics Concern  . Not on file  Social History Narrative   Lives in [place] with husband.  Does not routinely exercise.  Activity limited by progressive LE claudication.    ALLERGIES:    No Known Allergies  CURRENT MEDICATIONS:    Current Facility-Administered Medications  Medication Dose Route Frequency Provider Last Rate Last Dose  . 0.9 %  sodium chloride infusion (Manually program via Guardrails IV Fluids)   Intravenous Once Pyreddy, Reatha Harps, MD      . acetaminophen (TYLENOL) tablet 650 mg  650 mg Oral Q6H PRN Max Sane, MD   650 mg at 02/16/18 0411   Or  . acetaminophen (TYLENOL) suppository 650 mg  650 mg Rectal Q6H PRN Max Sane, MD      . ALPRAZolam Duanne Moron) tablet 0.25 mg  0.25 mg Oral BID PRN Cassandria Santee, MD      . alum & mag hydroxide-simeth (MAALOX/MYLANTA) 200-200-20 MG/5ML suspension 30 mL  30 mL Oral Q4H PRN Max Sane, MD    30 mL at 02/17/18 0022  . aspirin EC tablet 81 mg  81 mg Oral Daily Cassandria Santee, MD   81 mg at 02/17/18 0948  . bisacodyl (DULCOLAX) EC tablet 5 mg  5 mg Oral Daily PRN Manuella Ghazi, Vipul, MD      . ceFEPIme (MAXIPIME) 1 g in sodium chloride 0.9 % 100 mL IVPB  1 g Intravenous q1800 Charlett Nose, RPH 200 mL/hr at 02/16/18 1819 1 g at 02/16/18 1819  . Chlorhexidine Gluconate Cloth 2 % PADS 6 each  6 each Topical Q0600 Murlean Iba, MD   6 each at 02/17/18 1534  . docusate sodium (COLACE) capsule 100 mg  100 mg Oral BID Max Sane, MD      . feeding supplement (NEPRO CARB STEADY) liquid 237 mL  237 mL Oral BID BM Manuella Ghazi, Vipul, MD   237 mL at 02/16/18 1445  . HYDROcodone-acetaminophen (NORCO/VICODIN) 5-325 MG per tablet 1-2 tablet  1-2 tablet Oral Q4H PRN Max Sane, MD   1 tablet at 02/17/18 1302  . insulin aspart (novoLOG) injection 0-5 Units  0-5 Units Subcutaneous QHS Manuella Ghazi, Vipul, MD      . insulin aspart (novoLOG) injection 0-9 Units  0-9 Units Subcutaneous TID WC Max Sane, MD   2 Units at 02/17/18 1203  . ipratropium-albuterol (DUONEB) 0.5-2.5 (3) MG/3ML nebulizer solution 3 mL  3 mL Nebulization Q4H PRN Samaan, Maged, MD      . metoprolol tartrate (LOPRESSOR) tablet 37.5 mg  37.5 mg Oral Q6H Lance Coon, MD   37.5 mg at 02/17/18 1544  . multivitamin with minerals tablet 1 tablet  1 tablet Oral Daily Max Sane, MD   1 tablet at 02/17/18 0948  . ondansetron (ZOFRAN) tablet 4 mg  4 mg Oral Q6H PRN Max Sane, MD   4 mg at 02/17/18 0713   Or  . ondansetron (ZOFRAN) injection 4 mg  4 mg Intravenous Q6H PRN Max Sane, MD   4 mg at 02/17/18 1304  . promethazine (PHENERGAN) injection 12.5 mg  12.5 mg Intravenous Q6H PRN Pyreddy, Pavan, MD      . rosuvastatin (CRESTOR) tablet 10 mg  10 mg Oral q1800 Soyla Murphy, Maged, MD   10 mg at 02/16/18 1806  . traZODone (DESYREL) tablet 25 mg  25 mg Oral QHS PRN Max Sane, MD   25 mg at 02/15/18 2116    REVIEW OF SYSTEMS:   [X]  denotes positive  finding, [ ]   denotes negative finding Cardiac  Comments:  Chest pain or chest pressure:    Shortness of breath upon exertion:    Short of breath when lying flat:    Irregular heart rhythm: x       Vascular    Pain in calf, thigh, or hip brought on by ambulation:    Pain in feet at night that wakes you up from your sleep:     Blood clot in your veins:    Leg swelling:         Pulmonary    Oxygen at home:    Productive cough:     Wheezing:         Neurologic    Sudden weakness in arms or legs:     Sudden numbness in arms or legs:     Sudden onset of difficulty speaking or slurred speech:    Temporary loss of vision in one eye:     Problems with dizziness:         Gastrointestinal    Blood in stool:      Vomited blood:         Genitourinary    Burning when urinating:     Blood in urine:        Psychiatric    Major depression:         Hematologic    Bleeding problems:    Problems with blood clotting too easily:        Skin    Rashes or ulcers:        Constitutional    Fever or chills:     PHYSICAL EXAM:   Vitals:   02/17/18 0810 02/17/18 1543 02/17/18 1544 02/17/18 1715  BP:  117/82  112/70  Pulse: (!) 124 (!) 107 (!) 131 (!) 115  Resp:    19  Temp:    97.6 F (36.4 C)  TempSrc:    Oral  SpO2: 92% 93%  93%  Weight:      Height:        GENERAL: The patient is a well-nourished female, in no acute distress. The vital signs are documented above. CARDIAC: There is a regular rate and rhythm.  PULMONARY: Nonlabored respirations ABDOMEN: Soft and non-tender with normal pitched bowel sounds.  MUSCULOSKELETAL: There are no major deformities or cyanosis. NEUROLOGIC: No focal weakness or paresthesias are detected. SKIN: There are no ulcers or rashes noted. PSYCHIATRIC: The patient has a normal affect.  STUDIES:   None  ASSESSMENT and PLAN   Acute renal failure in need of dialysis: I discussed the details of placing a temporary femoral dialysis catheter  with the patient and her husband.  Unfortunately, her INR is close to 9 currently.  She received vitamin K this morning and is scheduled to get FFP today.  I discussed this with nephrology.  She is not a candidate for temporary dialysis catheter placement with an INR close to 9.  This will have to be significantly reduced before placing the catheter due to concerns of her bleeding.  Most likely, she will get a catheter placement tomorrow.   Annamarie Major, MD [facility] [phone] Pager [phone]

## 2018-02-17 NOTE — Progress Notes (Signed)
[place], [place] 02/17/18  Subjective:   Patient remains critically ill.  BUN/creatinine are further increased to 119/8.2 Potassium is high today at 5.4 Urine output remains minimal Patient has some shortness of breath, now  Very nauseous today Appetite is poor  Objective:  Vital signs in last 24 hours:  Temp:  [97.4 F (36.3 C)-98.4 F (36.9 C)] 98.4 F (36.9 C) (11/28 0719) Pulse Rate:  [96-134] 124 (11/28 0810) Resp:  [17-19] 19 (11/28 0719) BP: (84-128)/(69-89) 122/80 (11/28 0719) SpO2:  [89 %-94 %] 92 % (11/28 0810) Weight:  [111.6 kg] 111.6 kg (11/28 0439)  Weight change: 1.829 kg Filed Weights   02/15/18 0500 02/16/18 0628 02/17/18 0439  Weight: 109.5 kg 109.8 kg 111.6 kg    Intake/Output:    Intake/Output Summary (Last 24 hours) at 02/17/2018 1001 Last data filed at 02/16/2018 1800 Gross per 24 hour  Intake 0 ml  Output 0 ml  Net 0 ml    Physical Exam: General:  Morbidly obese lady, lying in the bed  HEENT  anicteric, moist oral mucous membranes  Neck:  Short, thick, supple  Lungs:  Limited exam, b/l crackles, [place] O2  Heart::  Tachycardic, irregular, atrial fibrillation  Abdomen:  Soft, distended  Extremities:  2+ edema  Neurologic:  Alert, able to answer questions  Skin:  Dry skin      Basic Metabolic Panel:  Recent Labs  Lab 02/10/18 1218 02/10/18 1520 02/05/2018 0717 02/15/18 0306 02/16/18 0338 02/17/18 0334  NA 132* 131* 130* 132* 132* 131*  K 2.9* 3.0* 5.0 4.5 5.1 5.4*  CL 85* 83* 87* 90* 91* 88*  CO2 21 21 20* 23 20* 20*  GLUCOSE 166* 134* 183* 104* 142* 169*  BUN 47* 49* 101* 99* 107* 119*  CREATININE 3.72* 3.46* 6.87* 7.21* 7.48* 8.21*  CALCIUM 8.8 8.5* 8.8* 8.6* 8.6* 9.0  MG 1.8  --   --  2.1  --   --   PHOS  --   --   --  7.1*  --   --      CBC: Recent Labs  Lab 02/10/18 1520 02/09/2018 1022 02/15/18 0306 02/16/18 0338 02/17/18 0334  WBC 18.4* 18.1* 18.6* 19.8* 24.7*  NEUTROABS 16.2*  --    --  17.3*  --   HGB 11.7 12.2 12.4 12.6 13.3  HCT 34.5 38.9 39.5 39.8 42.2  MCV 89 93.3 92.5 93.0 92.3  PLT 220 247 260 265 278     No results found for: HEPBSAG, HEPBSAB, HEPBIGM    Microbiology:  Recent Results (from the past 240 hour(s))  MRSA PCR Screening     Status: None   Collection Time: 01/26/2018 10:06 AM  Result Value Ref Range Status   MRSA by PCR NEGATIVE NEGATIVE Final    Comment:        The GeneXpert MRSA Assay (FDA approved for NASAL specimens only), is one component of a comprehensive MRSA colonization surveillance program. It is not intended to diagnose MRSA infection nor to guide or monitor treatment for MRSA infections. Performed at [facility], [address] [place], [place] [address]     Coagulation Studies: Recent Labs    02/02/2018 1022 02/15/18 0855 02/16/18 0822  LABPROT 66.0* 42.3* 40.5*  INR 8.05* 4.54* 4.29*    Urinalysis: Recent Labs    02/18/2018 1646  COLORURINE AMBER*  LABSPEC 1.013  PHURINE 5.0  GLUCOSEU NEGATIVE  HGBUR NEGATIVE  BILIRUBINUR NEGATIVE  KETONESUR NEGATIVE  PROTEINUR NEGATIVE  NITRITE NEGATIVE  LEUKOCYTESUR TRACE*      Imaging: Ct Chest Wo Contrast  Result Date: 02/15/2018 CLINICAL DATA:  67 year old female with pleural effusion. Question lung mass or pneumonia. Acute renal insufficiency. Subsequent encounter. EXAM: CT CHEST WITHOUT CONTRAST TECHNIQUE: Multidetector CT imaging of the chest was performed following the standard protocol without IV contrast. COMPARISON:  01/24/2018 chest x-ray. FINDINGS: Cardiovascular: Mild cardiomegaly. Prominent coronary artery calcification. Aortic valve calcification. Atherosclerotic changes thoracic aorta. Ascending thoracic aorta measures up to 3.7 cm. Prominent size pulmonary arteries. Cannot exclude component of pulmonary hypertension. Mediastinum/Nodes: Subcarinal adenopathy measuring up to 1.6 cm short axis. Lungs/Pleura: Consolidation left upper and left  lower lobe with moderate-size left-sided pleural effusion. Consolidation right lower lobe with small pleural effusion. Majority of bronchi are patent with the exception of distal left lower lobe bronchi. Right upper lobe 5 mm nodule (series 6, image 75 and series 3, image 62). Upper Abdomen: Irregular contour of the liver with findings raising possibility of multiple liver lesions. Dilated gallbladder with 2 cm stone. Atrophic right kidney. Question left renal cyst. Musculoskeletal: Kyphoscoliosis thoracic spine without osseous destructive lesion. IMPRESSION: 1. Consolidation left upper and left lower lobe with moderate-size left-sided pleural effusion. Consolidation right lower lobe with small pleural effusion. Majority of bronchi are patent with the exception of distal left lower lobe bronchi. Findings may reflect infectious process given the bilateral lung involvement. 2. Recommend aggressively treating any clinically suspected infectious process with close follow-up chest and abdomen CT (preferably with contrast if patient's renal function improves), as it is difficult to exclude a left hilar mass. Additional finding of right upper lobe 5 mm nodule (series 6, image 75 and series 3, image 62) and possibility of subtle multiple liver lesions suggests that malignancy is also a consideration. 3. Subcarinal adenopathy with short axis dimension 1.6 cm. 4. Mild cardiomegaly.  Prominent coronary artery calcification. 5. Aortic Atherosclerosis (ICD10-I70.0). Ascending thoracic aorta measures up to 3.7 cm. 6. Prominent size pulmonary arteries. Cannot exclude component of pulmonary hypertension. 7. Dilated gallbladder 2 cm gallstone. 8. Atrophic right kidney. Electronically Signed   By: Genia Del M.D.   On: 02/15/2018 16:11   Dg Chest Port 1 View  Result Date: 02/16/2018 CLINICAL DATA:  Atelectasis EXAM: PORTABLE CHEST 1 VIEW COMPARISON:  Chest CT from yesterday FINDINGS: Dense airspace disease on the left with  layering pleural effusion. Small pleural effusion on the right where there is also lower lobe airspace disease by CT. Cardiomegaly. No pneumothorax. IMPRESSION: Left more than right consolidation and pleural effusion that is stable to progressed from CT yesterday. CT appearance suggests pneumonia. Electronically Signed   By: Monte Fantasia M.D.   On: 02/16/2018 06:15     Medications:   . ceFEPime (MAXIPIME) IV 1 g (02/16/18 1819)   . aspirin EC  81 mg Oral Daily  . docusate sodium  100 mg Oral BID  . feeding supplement (NEPRO CARB STEADY)  237 mL Oral BID BM  . insulin aspart  0-5 Units Subcutaneous QHS  . insulin aspart  0-9 Units Subcutaneous TID WC  . metoprolol tartrate  37.5 mg Oral Q6H  . multivitamin with minerals  1 tablet Oral Daily  . rosuvastatin  10 mg Oral q1800   acetaminophen **OR** acetaminophen, ALPRAZolam, alum & mag hydroxide-simeth, bisacodyl, HYDROcodone-acetaminophen, ipratropium-albuterol, ondansetron **OR** ondansetron (ZOFRAN) IV, traZODone  Assessment/ Plan:  67 y.o. female with  medical problems of chronic systolic CHF, COPD, coronary disease with history of angioplasty, hypertension, atrial fibrillation, diabetes type  2, non-insulin-dependent, who was admitted to [facility] on 02/13/2018  2D echo November 18: LVEF 45 to 50%, moderate mitral regurgitation  1.  Acute kidney injury, baseline creatinine 0.97 from January 31, 2018 2.  Generalized edema 3.  Atrial fibrillation 4.  Hyperkalemia  Acute kidney injury is likely secondary to overdiuresis causing ATN Urine output remains minimal  Plan: -Maintain hemodynamics to avoid hypotension -Hold further diuretic administration -Renal ultrasound suggests right renal atrophy.  No hydronephrosis. -Patient has developed uremia and mild fluid overload with hyperkalemia Discussed with patient about starting dialysis today. Benefits and alternatives were discussed with patient and husband.  Consulted vascular surgery  for dialysis catheter placement.    LOS: 3 Rosi Secrist 11/28/201910:01 AM  [place], [place]  Note: This note was prepared with Dragon dictation. Any transcription errors are unintentional

## 2018-02-17 NOTE — Progress Notes (Signed)
Pharmacy Antibiotic Note  Crystal Pacheco is a 67 y.o. female admitted on 02/18/2018 with sepsis.  Pharmacy has been consulted for Vancomycin dosing.  Plan:  Per MD note, pt has ARF secondary to overdiuresis.  Pt may be started on HD soon but currently does not have HD catheter in place. Will order Vancomycin 1 gm IV X 1 for 11/28 @ 21:00.   No scheduled dosing or trough has been ordered since renal function may change rapidly or pt may be started on HD.   AdjBW = 77.5 kg Ke = 0.01 hr-1 T1/2 = 63 hrs Vd = 54.2 L    Height: 5\' 4"  (162.6 cm) Weight: 246 lb 0.5 oz (111.6 kg) IBW/kg (Calculated) : 54.7  Temp (24hrs), Avg:97.7 F (36.5 C), Min:97.6 F (36.4 C), Max:98.4 F (36.9 C)  Recent Labs  Lab 02/04/2018 0717 01/31/2018 1022 02/15/18 0306 02/16/18 0338 02/17/18 0334  WBC  --  18.1* 18.6* 19.8* 24.7*  CREATININE 6.87*  --  7.21* 7.48* 8.21*    Estimated Creatinine Clearance: 8 mL/min (A) (by C-G formula based on SCr of 8.21 mg/dL (H)).    No Known Allergies  Antimicrobials this admission:   >>    >>   Dose adjustments this admission:   Microbiology results:  BCx:   UCx:    Sputum:    MRSA PCR:   Thank you for allowing pharmacy to be a part of this patient's care.  Rangel Echeverri D 02/17/2018 8:56 PM

## 2018-02-17 NOTE — Progress Notes (Signed)
Progress Note  Patient Name: Crystal Pacheco Date of Encounter: 02/17/2018  Primary Cardiologist: Ida Rogue, MD   Subjective   Patient has worsening shortness of breath and was noted to be hypoxic.  She was placed on 2 L of oxygen.  Still with no urine output.  Leg edema is significantly worsening.  No chest pain.  She continues to be in atrial fibrillation with ventricular rate between 100 to 120 bpm.  Inpatient Medications    Scheduled Meds: . aspirin EC  81 mg Oral Daily  . docusate sodium  100 mg Oral BID  . feeding supplement (NEPRO CARB STEADY)  237 mL Oral BID BM  . insulin aspart  0-5 Units Subcutaneous QHS  . insulin aspart  0-9 Units Subcutaneous TID WC  . metoprolol tartrate  37.5 mg Oral Q6H  . multivitamin with minerals  1 tablet Oral Daily  . rosuvastatin  10 mg Oral q1800   Continuous Infusions: . ceFEPime (MAXIPIME) IV 1 g (02/16/18 1819)   PRN Meds: acetaminophen **OR** acetaminophen, ALPRAZolam, alum & mag hydroxide-simeth, bisacodyl, HYDROcodone-acetaminophen, ipratropium-albuterol, ondansetron **OR** ondansetron (ZOFRAN) IV, traZODone   Vital Signs    Vitals:   02/17/18 0619 02/17/18 0719 02/17/18 0808 02/17/18 0810  BP:  122/80    Pulse: (!) 106 (!) 132 (!) 134 (!) 124  Resp:  19    Temp:  98.4 F (36.9 C)    TempSrc:  Oral    SpO2:  90% (!) 89% 92%  Weight:      Height:        Intake/Output Summary (Last 24 hours) at 02/17/2018 1012 Last data filed at 02/16/2018 1800 Gross per 24 hour  Intake 0 ml  Output 0 ml  Net 0 ml   Filed Weights   02/15/18 0500 02/16/18 0628 02/17/18 0439  Weight: 109.5 kg 109.8 kg 111.6 kg    Telemetry    Atrial fibrillation with between 100 to 130 bpm- Personally Reviewed  ECG     - Personally Reviewed  Physical Exam   GEN: No acute distress.   Neck:  Significant JVD Cardiac:  Irregularly irregular mildly tachycardic, no murmurs, rubs, or gallops.  Respiratory:  Bibasilar crackles GI: Soft,  nontender, non-distended  MS:  +3 edema; No deformity. Neuro:  Nonfocal  Psych: Normal affect   Labs    Chemistry Recent Labs  Lab 02/10/18 1218  02/15/18 0306 02/16/18 0338 02/17/18 0334  NA 132*   < > 132* 132* 131*  K 2.9*   < > 4.5 5.1 5.4*  CL 85*   < > 90* 91* 88*  CO2 21   < > 23 20* 20*  GLUCOSE 166*   < > 104* 142* 169*  BUN 47*   < > 99* 107* 119*  CREATININE 3.72*   < > 7.21* 7.48* 8.21*  CALCIUM 8.8   < > 8.6* 8.6* 9.0  PROT 6.2  --   --  6.4*  --   ALBUMIN 2.9*  --   --  2.5*  --   AST 20  --   --  20  --   ALT 11  --   --  13  --   ALKPHOS 104  --   --  95  --   BILITOT 2.0*  --   --  1.9*  --   GFRNONAA 12*   < > 5* 5* 5*  GFRAA 14*   < > 6* 6* 5*  ANIONGAP  --    < >  19* 21* 23*   < > = values in this interval not displayed.     Hematology Recent Labs  Lab 02/15/18 0306 02/16/18 0338 02/17/18 0334  WBC 18.6* 19.8* 24.7*  RBC 4.27 4.28 4.57  HGB 12.4 12.6 13.3  HCT 39.5 39.8 42.2  MCV 92.5 93.0 92.3  MCH 29.0 29.4 29.1  MCHC 31.4 31.7 31.5  RDW 15.1 15.3 15.4  PLT 260 265 278    Cardiac EnzymesNo results for input(s): TROPONINI in the last 168 hours. No results for input(s): TROPIPOC in the last 168 hours.   BNP Recent Labs  Lab 02/10/18 1218  BNP 2,052.4*     DDimer No results for input(s): DDIMER in the last 168 hours.   Radiology    Ct Chest Wo Contrast  Result Date: 02/15/2018 CLINICAL DATA:  67 year old female with pleural effusion. Question lung mass or pneumonia. Acute renal insufficiency. Subsequent encounter. EXAM: CT CHEST WITHOUT CONTRAST TECHNIQUE: Multidetector CT imaging of the chest was performed following the standard protocol without IV contrast. COMPARISON:  01/24/2018 chest x-ray. FINDINGS: Cardiovascular: Mild cardiomegaly. Prominent coronary artery calcification. Aortic valve calcification. Atherosclerotic changes thoracic aorta. Ascending thoracic aorta measures up to 3.7 cm. Prominent size pulmonary arteries.  Cannot exclude component of pulmonary hypertension. Mediastinum/Nodes: Subcarinal adenopathy measuring up to 1.6 cm short axis. Lungs/Pleura: Consolidation left upper and left lower lobe with moderate-size left-sided pleural effusion. Consolidation right lower lobe with small pleural effusion. Majority of bronchi are patent with the exception of distal left lower lobe bronchi. Right upper lobe 5 mm nodule (series 6, image 75 and series 3, image 62). Upper Abdomen: Irregular contour of the liver with findings raising possibility of multiple liver lesions. Dilated gallbladder with 2 cm stone. Atrophic right kidney. Question left renal cyst. Musculoskeletal: Kyphoscoliosis thoracic spine without osseous destructive lesion. IMPRESSION: 1. Consolidation left upper and left lower lobe with moderate-size left-sided pleural effusion. Consolidation right lower lobe with small pleural effusion. Majority of bronchi are patent with the exception of distal left lower lobe bronchi. Findings may reflect infectious process given the bilateral lung involvement. 2. Recommend aggressively treating any clinically suspected infectious process with close follow-up chest and abdomen CT (preferably with contrast if patient's renal function improves), as it is difficult to exclude a left hilar mass. Additional finding of right upper lobe 5 mm nodule (series 6, image 75 and series 3, image 62) and possibility of subtle multiple liver lesions suggests that malignancy is also a consideration. 3. Subcarinal adenopathy with short axis dimension 1.6 cm. 4. Mild cardiomegaly.  Prominent coronary artery calcification. 5. Aortic Atherosclerosis (ICD10-I70.0). Ascending thoracic aorta measures up to 3.7 cm. 6. Prominent size pulmonary arteries. Cannot exclude component of pulmonary hypertension. 7. Dilated gallbladder 2 cm gallstone. 8. Atrophic right kidney. Electronically Signed   By: Genia Del M.D.   On: 02/15/2018 16:11   Dg Chest Port 1  View  Result Date: 02/16/2018 CLINICAL DATA:  Atelectasis EXAM: PORTABLE CHEST 1 VIEW COMPARISON:  Chest CT from yesterday FINDINGS: Dense airspace disease on the left with layering pleural effusion. Small pleural effusion on the right where there is also lower lobe airspace disease by CT. Cardiomegaly. No pneumothorax. IMPRESSION: Left more than right consolidation and pleural effusion that is stable to progressed from CT yesterday. CT appearance suggests pneumonia. Electronically Signed   By: Monte Fantasia M.D.   On: 02/16/2018 06:15    Cardiac Studies   02/07/2018 TTE Study Conclusions - Left ventricle: The cavity size was normal.  Wall thickness was normal. Systolic function was mildly reduced. The estimated ejection fraction was in the range of 45% to 50%. - Aortic valve: Valve area (VTI): 0.98 cm^2. Valve area (Vmax): 1.54 cm^2. Valve area (Vmean): 1.37 cm^2. - Mitral valve: There was mild to moderate regurgitation.  Patient Profile     67 y.o. female with a h/o Afib with RVR, CAD, chronic combined systolic and diastolic heart failure, HTN, HLD, remote tobacco abuse, COPD, prior CVA, and DM2 seen for the management of Afib with RVR s/p TEE/DCCV cancellation d/t renal failure.  Assessment & Plan    1.  Atrial fibrillation with rapid ventricular response: Continue with metoprolol.  Xarelto has been discontinued due to acute renal failure but INR continues to be elevated.  Plan is to start heparin once INR is below 2. The plan is to proceed with TEE guided cardioversion before hospital discharge once renal function improves.  2.  Acute renal failure: ATN from overdiuresis.  Minimal urine output and the patient is now significantly volume overloaded with some uremic symptoms.  I discussed with Dr. Candiss Norse and we both agree that it is best to initiate dialysis today before she develops more hemodynamic compromise.  Midodrine can be used to support blood pressure if needed.  3.   Coronary artery disease: Currently with no anginal symptoms.  Continue low-dose aspirin, metoprolol and rosuvastatin.  4.  Chronic systolic heart failure: EF was 45 to 50% by echo likely due to tachycardia induced cardiomyopathy.  She is significantly volume overloaded due to anuric acute renal failure.  Dialysis to be started today.        For questions or updates, please contact [place] Please consult [email or web address] for contact info under        Signed, Kathlyn Sacramento, MD  02/17/2018, 10:12 AM

## 2018-02-17 NOTE — Progress Notes (Signed)
Advanced care plan. Purpose of the Encounter: CODE STATUS Parties in Attendance:Patient Patient's Decision Capacity:Good Subjective/Patient's story: Presented for renal failure Objective/Medical story Patient needs nephrology evaluation, iv fluids and possible dialysis Goals of care determination:  Advance care directives and goals of care discussed. Patient wants evrything done which includes cpr, intubation and ventilator if need arises. CODE STATUS: Full code Time spent discussing advanced care planning: 16 minutes

## 2018-02-17 NOTE — Plan of Care (Signed)
  Problem: Clinical Measurements: Goal: Diagnostic test results will improve Outcome: Not Progressing Note:  Kidney function worsened  BUN 119 Creatinine 8.21

## 2018-02-17 NOTE — Progress Notes (Addendum)
[place] [facility] [place] NAME: Crystal Pacheco    MR#:  [id number]  DATE OF BIRTH:  1950-09-10  SUBJECTIVE:  Patient seen and evaluated today Has increased nausea Zofran not helping her BUN and creatinine continues to go up REVIEW OF SYSTEMS:  Review of Systems  Constitutional: Negative for diaphoresis, fever, malaise/fatigue and weight loss.  HENT: Negative for ear discharge, ear pain, hearing loss, nosebleeds, sore throat and tinnitus.   Eyes: Negative for blurred vision and pain.  Respiratory: Negative for cough, hemoptysis, shortness of breath and wheezing.   Cardiovascular: Negative for chest pain, palpitations, orthopnea and leg swelling.  Gastrointestinal: Negative for abdominal pain, blood in stool, constipation, diarrhea, heartburn, nausea and vomiting.  Genitourinary: Negative for dysuria, frequency and urgency.  Musculoskeletal: Negative for back pain and myalgias.  Skin: Negative for itching and rash.  Neurological: Negative for dizziness, tingling, tremors, focal weakness, seizures, weakness and headaches.  Psychiatric/Behavioral: Negative for depression. The patient is not nervous/anxious.   Has nausea DRUG ALLERGIES:  No Known Allergies VITALS:  Blood pressure 122/80, pulse (!) 124, temperature 98.4 F (36.9 C), temperature source Oral, resp. rate 19, height 5\' 4"  (1.626 m), weight 111.6 kg, SpO2 92 %. PHYSICAL EXAMINATION:  Physical Exam  Constitutional: She is oriented to person, place, and time.  HENT:  Head: Normocephalic and atraumatic.  Eyes: Pupils are equal, round, and reactive to light. Conjunctivae and EOM are normal.  Neck: Normal range of motion. Neck supple. No tracheal deviation present. No thyromegaly present.  Cardiovascular: Normal rate, regular rhythm and normal heart sounds.  Pulmonary/Chest: Effort normal and breath sounds normal. No respiratory distress. She has no wheezes. She exhibits no tenderness.    Abdominal: Soft. Bowel sounds are normal. She exhibits no distension. There is no tenderness.  Musculoskeletal: Normal range of motion.  Neurological: She is alert and oriented to person, place, and time. No cranial nerve deficit.  Skin: Skin is warm and dry. No rash noted.   LABORATORY PANEL:  Female CBC Recent Labs  Lab 02/17/18 0334  WBC 24.7*  HGB 13.3  HCT 42.2  PLT 278   ------------------------------------------------------------------------------------------------------------------ Chemistries  Recent Labs  Lab 02/15/18 0306 02/16/18 0338 02/17/18 0334  NA 132* 132* 131*  K 4.5 5.1 5.4*  CL 90* 91* 88*  CO2 23 20* 20*  GLUCOSE 104* 142* 169*  BUN 99* 107* 119*  CREATININE 7.21* 7.48* 8.21*  CALCIUM 8.6* 8.6* 9.0  MG 2.1  --   --   AST  --  20  --   ALT  --  13  --   ALKPHOS  --  95  --   BILITOT  --  1.9*  --    RADIOLOGY:  No results found. ASSESSMENT AND PLAN:  67 year old female patient under hospitalist service for renal failure  *Acute renal failure worsening: creatinine greater than 8 - likely ATN - Nephrolofy follow-up for dialysis today - hold Diuretics and any nephrotoxic meds -Hemodialysis today  * A.fib with RVR - Increased Metoprolol 37.5 Q 6 hrs - Hold Xarelto. INR INR greater than 8 - Cardiology follow-up  *Supratherapeutic INR -Xarelto on hold -Vitamin K 10 mg subcu now  * Chronic combined systolic and diastolic heart failure: EF 45-50% -Medical management  * Diabetes mellitus type 2 - SSI for now, hold metformin  * Nausea intractable probably secondary to uremia -Has worsening renal failure -Will get dialysis today -Intravenously antiemetics  All the records are  reviewed and case discussed with Care Management/Social Worker. Management plans discussed with the patient, family and they are in agreement.  CODE STATUS: Full Code  TOTAL TIME TAKING CARE OF THIS PATIENT: 34 minutes.   More than 50% of the time was spent  in counseling/coordination of care: YES  POSSIBLE D/C IN 3-4 DAYS, DEPENDING ON CLINICAL CONDITION. And Cardiac, Nephro eval   Saundra Shelling M.D on 02/17/2018 at 11:27 AM  Between 7am to 6pm - Pager - [phone]  After 6pm go to [email or web address] - Proofreader  [facility]  Office  [phone]  CC: Primary care physician; Birdie Sons, MD  Note: This dictation was prepared with Dragon dictation along with smaller phrase technology. Any transcriptional errors that result from this process are unintentional.

## 2018-02-17 NOTE — Progress Notes (Signed)
Patient still complaining of nausea despite prn zofran. Patient received one dose of phenergan earlier and stated that helped patient. Dr. Estanislado Pandy paged to notify.   Update:Verbal orders with read back for PRN Phenergan 12.5mg  IV Q6hrs for nausea. Will administer and continue to monitor patient.

## 2018-02-17 NOTE — Progress Notes (Signed)
Patient receiving first bag of fresh frozen plasma. Tolerating well. Patient will receive another bag after this one. Per Dr. Marguerite Olea recheck INR level after receiving second bag of fresh frozen plasma. Will pass along to the oncoming shift. Nursing will continue to monitor.

## 2018-02-18 ENCOUNTER — Inpatient Hospital Stay: Payer: Medicare Other

## 2018-02-18 ENCOUNTER — Other Ambulatory Visit: Payer: Self-pay

## 2018-02-18 DIAGNOSIS — A419 Sepsis, unspecified organism: Secondary | ICD-10-CM

## 2018-02-18 DIAGNOSIS — J9601 Acute respiratory failure with hypoxia: Secondary | ICD-10-CM

## 2018-02-18 LAB — PROTIME-INR
INR: 7.65
INR: 8.82
INR: 10.49 — AB
INR: 5.25
PROTHROMBIN TIME: 70.8 s — AB (ref 11.4–15.2)
Prothrombin Time: 63.4 seconds — ABNORMAL HIGH (ref 11.4–15.2)
Prothrombin Time: 81 seconds — ABNORMAL HIGH (ref 11.4–15.2)
Prothrombin Time: 47.4 seconds — ABNORMAL HIGH (ref 11.4–15.2)

## 2018-02-18 LAB — BLOOD GAS, ARTERIAL
ACID-BASE DEFICIT: 24.1 mmol/L — AB (ref 0.0–2.0)
Acid-base deficit: 20.1 mmol/L — ABNORMAL HIGH (ref 0.0–2.0)
Bicarbonate: 8.6 mmol/L — ABNORMAL LOW (ref 20.0–28.0)
Bicarbonate: 7.3 mmol/L — ABNORMAL LOW (ref 20.0–28.0)
FIO2: 0.7
FIO2: 0.7
MECHVT: 450 mL
MECHVT: 450 mL
Mechanical Rate: 18
O2 Saturation: 98.1 %
O2 Saturation: 52.4 %
PEEP: 5 cmH2O
PEEP: 5 cmH2O
Patient temperature: 37
Patient temperature: 37
RATE: 20 {breaths}/min
pCO2 arterial: 29 mmHg — ABNORMAL LOW (ref 32.0–48.0)
pCO2 arterial: 33 mmHg (ref 32.0–48.0)
pH, Arterial: 7.08 — CL (ref 7.350–7.450)
pH, Arterial: 6.95 — CL (ref 7.350–7.450)
pO2, Arterial: 142 mmHg — ABNORMAL HIGH (ref 83.0–108.0)
pO2, Arterial: 47 mmHg — ABNORMAL LOW (ref 83.0–108.0)

## 2018-02-18 LAB — RENAL FUNCTION PANEL
ALBUMIN: 3.3 g/dL — AB (ref 3.5–5.0)
ANION GAP: 25 — AB (ref 5–15)
Albumin: 2.7 g/dL — ABNORMAL LOW (ref 3.5–5.0)
Albumin: 2.6 g/dL — ABNORMAL LOW (ref 3.5–5.0)
Albumin: 2.8 g/dL — ABNORMAL LOW (ref 3.5–5.0)
Anion gap: 25 — ABNORMAL HIGH (ref 5–15)
Anion gap: 28 — ABNORMAL HIGH (ref 5–15)
Anion gap: 29 — ABNORMAL HIGH (ref 5–15)
BUN: 117 mg/dL — ABNORMAL HIGH (ref 8–23)
BUN: 96 mg/dL — ABNORMAL HIGH (ref 8–23)
BUN: 75 mg/dL — ABNORMAL HIGH (ref 8–23)
BUN: 62 mg/dL — ABNORMAL HIGH (ref 8–23)
CO2: 13 mmol/L — ABNORMAL LOW (ref 22–32)
CO2: 12 mmol/L — ABNORMAL LOW (ref 22–32)
CO2: 10 mmol/L — ABNORMAL LOW (ref 22–32)
CO2: 10 mmol/L — ABNORMAL LOW (ref 22–32)
Calcium: 8 mg/dL — ABNORMAL LOW (ref 8.9–10.3)
Calcium: 7.7 mg/dL — ABNORMAL LOW (ref 8.9–10.3)
Calcium: 7.5 mg/dL — ABNORMAL LOW (ref 8.9–10.3)
Calcium: 7.3 mg/dL — ABNORMAL LOW (ref 8.9–10.3)
Chloride: 95 mmol/L — ABNORMAL LOW (ref 98–111)
Chloride: 99 mmol/L (ref 98–111)
Chloride: 97 mmol/L — ABNORMAL LOW (ref 98–111)
Chloride: 96 mmol/L — ABNORMAL LOW (ref 98–111)
Creatinine, Ser: 8.04 mg/dL — ABNORMAL HIGH (ref 0.44–1.00)
Creatinine, Ser: 5.99 mg/dL — ABNORMAL HIGH (ref 0.44–1.00)
Creatinine, Ser: 4.79 mg/dL — ABNORMAL HIGH (ref 0.44–1.00)
Creatinine, Ser: 3.93 mg/dL — ABNORMAL HIGH (ref 0.44–1.00)
GFR calc Af Amer: 8 mL/min — ABNORMAL LOW (ref >60)
GFR calc Af Amer: 13 mL/min — ABNORMAL LOW (ref >60)
GFR calc non Af Amer: 5 mL/min — ABNORMAL LOW (ref >60)
GFR calc non Af Amer: 7 mL/min — ABNORMAL LOW (ref >60)
GFR calc non Af Amer: 9 mL/min — ABNORMAL LOW (ref >60)
GFR calc non Af Amer: 11 mL/min — ABNORMAL LOW (ref >60)
GFR, EST AFRICAN AMERICAN: 5 mL/min — AB (ref >60)
GFR, EST AFRICAN AMERICAN: 10 mL/min — AB (ref >60)
Glucose, Bld: 136 mg/dL — ABNORMAL HIGH (ref 70–99)
Glucose, Bld: 135 mg/dL — ABNORMAL HIGH (ref 70–99)
Glucose, Bld: 158 mg/dL — ABNORMAL HIGH (ref 70–99)
Glucose, Bld: 184 mg/dL — ABNORMAL HIGH (ref 70–99)
PHOSPHORUS: 8.3 mg/dL — AB (ref 2.5–4.6)
Phosphorus: 10.2 mg/dL — ABNORMAL HIGH (ref 2.5–4.6)
Phosphorus: 8.9 mg/dL — ABNORMAL HIGH (ref 2.5–4.6)
Phosphorus: 7.9 mg/dL — ABNORMAL HIGH (ref 2.5–4.6)
Potassium: 6 mmol/L — ABNORMAL HIGH (ref 3.5–5.1)
Potassium: 5.3 mmol/L — ABNORMAL HIGH (ref 3.5–5.1)
Potassium: 5.1 mmol/L (ref 3.5–5.1)
Potassium: 4.9 mmol/L (ref 3.5–5.1)
SODIUM: 136 mmol/L (ref 135–145)
SODIUM: 135 mmol/L (ref 135–145)
Sodium: 133 mmol/L — ABNORMAL LOW (ref 135–145)
Sodium: 135 mmol/L (ref 135–145)

## 2018-02-18 LAB — HEMOGLOBIN AND HEMATOCRIT, BLOOD
HCT: 31.8 % — ABNORMAL LOW (ref 36.0–46.0)
HCT: 26.7 % — ABNORMAL LOW (ref 36.0–46.0)
HCT: 23.2 % — ABNORMAL LOW (ref 36.0–46.0)
Hemoglobin: 9.5 g/dL — ABNORMAL LOW (ref 12.0–15.0)
Hemoglobin: 7.7 g/dL — ABNORMAL LOW (ref 12.0–15.0)
Hemoglobin: 6.5 g/dL — ABNORMAL LOW (ref 12.0–15.0)

## 2018-02-18 LAB — CBC
HCT: 43.6 % (ref 36.0–46.0)
Hemoglobin: 13 g/dL (ref 12.0–15.0)
MCH: 29.2 pg (ref 26.0–34.0)
MCHC: 29.8 g/dL — ABNORMAL LOW (ref 30.0–36.0)
MCV: 98 fL (ref 80.0–100.0)
PLATELETS: 240 10*3/uL (ref 150–400)
RBC: 4.45 MIL/uL (ref 3.87–5.11)
RDW: 15.7 % — ABNORMAL HIGH (ref 11.5–15.5)
WBC: 24.9 10*3/uL — ABNORMAL HIGH (ref 4.0–10.5)
nRBC: 3.9 % — ABNORMAL HIGH (ref 0.0–0.2)

## 2018-02-18 LAB — LIPASE, BLOOD: LIPASE: 86 U/L — AB (ref 11–51)

## 2018-02-18 LAB — CBC WITH DIFFERENTIAL/PLATELET
Abs Immature Granulocytes: 1.15 10*3/uL — ABNORMAL HIGH (ref 0.00–0.07)
BASOS PCT: 0 %
Basophils Absolute: 0.1 10*3/uL (ref 0.0–0.1)
Eosinophils Absolute: 0 10*3/uL (ref 0.0–0.5)
Eosinophils Relative: 0 %
HCT: 32.7 % — ABNORMAL LOW (ref 36.0–46.0)
HEMOGLOBIN: 9.5 g/dL — AB (ref 12.0–15.0)
Immature Granulocytes: 5 %
Lymphocytes Relative: 5 %
Lymphs Abs: 1.3 10*3/uL (ref 0.7–4.0)
MCH: 29.7 pg (ref 26.0–34.0)
MCHC: 29.1 g/dL — ABNORMAL LOW (ref 30.0–36.0)
MCV: 102.2 fL — ABNORMAL HIGH (ref 80.0–100.0)
Monocytes Absolute: 1.6 10*3/uL — ABNORMAL HIGH (ref 0.1–1.0)
Monocytes Relative: 7 %
Neutro Abs: 19.6 10*3/uL — ABNORMAL HIGH (ref 1.7–7.7)
Neutrophils Relative %: 83 %
Platelets: 154 10*3/uL (ref 150–400)
RBC: 3.2 MIL/uL — ABNORMAL LOW (ref 3.87–5.11)
RDW: 15.7 % — ABNORMAL HIGH (ref 11.5–15.5)
SMEAR REVIEW: UNDETERMINED
WBC: 23.7 10*3/uL — ABNORMAL HIGH (ref 4.0–10.5)
nRBC: 5.9 % — ABNORMAL HIGH (ref 0.0–0.2)

## 2018-02-18 LAB — URINALYSIS, COMPLETE (UACMP) WITH MICROSCOPIC
Bilirubin Urine: NEGATIVE
Glucose, UA: NEGATIVE mg/dL
KETONES UR: 5 mg/dL — AB
Nitrite: NEGATIVE
Protein, ur: NEGATIVE mg/dL
Specific Gravity, Urine: 1.016 (ref 1.005–1.030)
pH: 5 (ref 5.0–8.0)

## 2018-02-18 LAB — GLUCOSE, CAPILLARY
GLUCOSE-CAPILLARY: 116 mg/dL — AB (ref 70–99)
Glucose-Capillary: 118 mg/dL — ABNORMAL HIGH (ref 70–99)
Glucose-Capillary: 118 mg/dL — ABNORMAL HIGH (ref 70–99)
Glucose-Capillary: 127 mg/dL — ABNORMAL HIGH (ref 70–99)
Glucose-Capillary: 128 mg/dL — ABNORMAL HIGH (ref 70–99)
Glucose-Capillary: 138 mg/dL — ABNORMAL HIGH (ref 70–99)

## 2018-02-18 LAB — BASIC METABOLIC PANEL
Anion gap: 28 — ABNORMAL HIGH (ref 5–15)
BUN: 129 mg/dL — ABNORMAL HIGH (ref 8–23)
CO2: 12 mmol/L — ABNORMAL LOW (ref 22–32)
Calcium: 9.1 mg/dL (ref 8.9–10.3)
Chloride: 90 mmol/L — ABNORMAL LOW (ref 98–111)
Creatinine, Ser: 9.02 mg/dL — ABNORMAL HIGH (ref 0.44–1.00)
GFR calc Af Amer: 5 mL/min — ABNORMAL LOW (ref >60)
GFR, EST NON AFRICAN AMERICAN: 4 mL/min — AB (ref >60)
Glucose, Bld: 125 mg/dL — ABNORMAL HIGH (ref 70–99)
Potassium: 6.7 mmol/L (ref 3.5–5.1)
Sodium: 130 mmol/L — ABNORMAL LOW (ref 135–145)

## 2018-02-18 LAB — MRSA PCR SCREENING: MRSA by PCR: NEGATIVE

## 2018-02-18 LAB — LACTIC ACID, PLASMA
Lactic Acid, Venous: 6.6 mmol/L (ref 0.5–1.9)
Lactic Acid, Venous: 9.9 mmol/L (ref 0.5–1.9)

## 2018-02-18 LAB — BASIC METABOLIC PANEL WITH GFR
Anion gap: 28 — ABNORMAL HIGH (ref 5–15)
BUN: 125 mg/dL — ABNORMAL HIGH (ref 8–23)
CO2: 13 mmol/L — ABNORMAL LOW (ref 22–32)
Calcium: 8.4 mg/dL — ABNORMAL LOW (ref 8.9–10.3)
Chloride: 92 mmol/L — ABNORMAL LOW (ref 98–111)
Creatinine, Ser: 8.69 mg/dL — ABNORMAL HIGH (ref 0.44–1.00)
GFR calc Af Amer: 5 mL/min — ABNORMAL LOW
GFR calc non Af Amer: 4 mL/min — ABNORMAL LOW
Glucose, Bld: 137 mg/dL — ABNORMAL HIGH (ref 70–99)
Potassium: 5.6 mmol/L — ABNORMAL HIGH (ref 3.5–5.1)
Sodium: 133 mmol/L — ABNORMAL LOW (ref 135–145)

## 2018-02-18 LAB — MAGNESIUM
MAGNESIUM: 2.4 mg/dL (ref 1.7–2.4)
Magnesium: 3.2 mg/dL — ABNORMAL HIGH (ref 1.7–2.4)

## 2018-02-18 LAB — FIBRINOGEN: Fibrinogen: 119 mg/dL — ABNORMAL LOW (ref 210–475)

## 2018-02-18 LAB — PATHOLOGIST SMEAR REVIEW

## 2018-02-18 LAB — PHOSPHORUS: Phosphorus: 9.6 mg/dL — ABNORMAL HIGH (ref 2.5–4.6)

## 2018-02-18 LAB — APTT: aPTT: 39 seconds — ABNORMAL HIGH (ref 24–36)

## 2018-02-18 LAB — ABO/RH: ABO/RH(D): B POS

## 2018-02-18 LAB — AMYLASE: Amylase: 44 U/L (ref 28–100)

## 2018-02-18 LAB — PROCALCITONIN: Procalcitonin: 4.31 ng/mL

## 2018-02-18 MED ORDER — ORAL CARE MOUTH RINSE
15.0000 mL | OROMUCOSAL | Status: DC
  Administered 2018-02-18 – 2018-02-19 (×10): 15 mL via OROMUCOSAL

## 2018-02-18 MED ORDER — VANCOMYCIN HCL IN DEXTROSE 1-5 GM/200ML-% IV SOLN
1000.0000 mg | INTRAVENOUS | Status: DC
  Administered 2018-02-19: 1000 mg via INTRAVENOUS
  Filled 2018-02-18 (×2): qty 200

## 2018-02-18 MED ORDER — DEXTROSE 50 % IV SOLN
1.0000 | Freq: Once | INTRAVENOUS | Status: AC
  Administered 2018-02-18: 50 mL via INTRAVENOUS
  Filled 2018-02-18: qty 50

## 2018-02-18 MED ORDER — PROTHROMBIN COMPLEX CONC HUMAN 500 UNITS IV KIT
2200.0000 [IU] | PACK | Freq: Once | Status: AC
  Administered 2018-02-18: 2200 [IU] via INTRAVENOUS
  Filled 2018-02-18 (×3): qty 2200

## 2018-02-18 MED ORDER — VASOPRESSIN 20 UNIT/ML IV SOLN
0.0300 [IU]/min | INTRAVENOUS | Status: DC
  Administered 2018-02-18: 0.03 [IU]/min via INTRAVENOUS
  Administered 2018-02-19: 0.04 [IU]/min via INTRAVENOUS
  Filled 2018-02-18 (×2): qty 2

## 2018-02-18 MED ORDER — ANTICOAGULANT SODIUM CITRATE 4% (200MG/5ML) IV SOLN
5.0000 mL | Status: DC | PRN
  Filled 2018-02-18: qty 5

## 2018-02-18 MED ORDER — SODIUM CHLORIDE 0.9% IV SOLUTION: Freq: Once | INTRAVENOUS | Status: DC

## 2018-02-18 MED ORDER — SODIUM CHLORIDE 0.9 % IV SOLN
2.0000 g | Freq: Two times a day (BID) | INTRAVENOUS | Status: DC
  Administered 2018-02-18 – 2018-02-19 (×2): 2 g via INTRAVENOUS
  Filled 2018-02-18 (×3): qty 2

## 2018-02-18 MED ORDER — ETOMIDATE 2 MG/ML IV SOLN
INTRAVENOUS | Status: AC
  Administered 2018-02-18: 20 mg via INTRAVENOUS
  Filled 2018-02-18: qty 10

## 2018-02-18 MED ORDER — FENTANYL CITRATE (PF) 100 MCG/2ML IJ SOLN
INTRAMUSCULAR | Status: AC
  Administered 2018-02-18: 50 ug via INTRAVENOUS
  Filled 2018-02-18: qty 2

## 2018-02-18 MED ORDER — METRONIDAZOLE IN NACL 5-0.79 MG/ML-% IV SOLN
500.0000 mg | Freq: Three times a day (TID) | INTRAVENOUS | Status: DC
  Administered 2018-02-18 – 2018-02-19 (×4): 500 mg via INTRAVENOUS
  Filled 2018-02-18 (×6): qty 100

## 2018-02-18 MED ORDER — POLYVINYL ALCOHOL 1.4 % OP SOLN
1.0000 [drp] | OPHTHALMIC | Status: DC | PRN
  Administered 2018-02-18: 1 [drp] via OPHTHALMIC
  Filled 2018-02-18: qty 15

## 2018-02-18 MED ORDER — VANCOMYCIN HCL IN DEXTROSE 1-5 GM/200ML-% IV SOLN
1000.0000 mg | INTRAVENOUS | Status: DC
  Filled 2018-02-18: qty 200

## 2018-02-18 MED ORDER — VITAMIN K1 10 MG/ML IJ SOLN
10.0000 mg | Freq: Once | INTRAVENOUS | Status: AC
  Administered 2018-02-18: 10 mg via INTRAVENOUS
  Filled 2018-02-18: qty 1

## 2018-02-18 MED ORDER — NOREPINEPHRINE 4 MG/250ML-% IV SOLN
0.0000 ug/min | INTRAVENOUS | Status: DC
  Administered 2018-02-18: 10 ug/min via INTRAVENOUS

## 2018-02-18 MED ORDER — ROCURONIUM BROMIDE 50 MG/5ML IV SOLN
INTRAVENOUS | Status: AC
  Administered 2018-02-18: 50 mg via INTRAVENOUS
  Filled 2018-02-18: qty 1

## 2018-02-18 MED ORDER — PUREFLOW DIALYSIS SOLUTION
INTRAVENOUS | Status: DC
  Administered 2018-02-18 (×2): via INTRAVENOUS_CENTRAL
  Administered 2018-02-19: 3 via INTRAVENOUS_CENTRAL

## 2018-02-18 MED ORDER — SODIUM POLYSTYRENE SULFONATE 15 GM/60ML PO SUSP
45.0000 g | Freq: Once | ORAL | Status: AC
  Administered 2018-02-18: 45 g via RECTAL
  Filled 2018-02-18: qty 180

## 2018-02-18 MED ORDER — SODIUM CHLORIDE 0.9 % IV BOLUS
250.0000 mL | Freq: Once | INTRAVENOUS | Status: AC
  Administered 2018-02-18: 250 mL via INTRAVENOUS

## 2018-02-18 MED ORDER — PANTOPRAZOLE SODIUM 40 MG IV SOLR: 40.0000 mg | Freq: Two times a day (BID) | INTRAVENOUS | Status: DC

## 2018-02-18 MED ORDER — ALBUTEROL SULFATE (2.5 MG/3ML) 0.083% IN NEBU
10.0000 mg | INHALATION_SOLUTION | Freq: Once | RESPIRATORY_TRACT | Status: AC
  Administered 2018-02-18: 10 mg via RESPIRATORY_TRACT
  Filled 2018-02-18: qty 12

## 2018-02-18 MED ORDER — ARTIFICIAL TEARS OPHTHALMIC OINT
TOPICAL_OINTMENT | OPHTHALMIC | Status: DC | PRN
  Filled 2018-02-18: qty 3.5

## 2018-02-18 MED ORDER — ALBUTEROL SULFATE (2.5 MG/3ML) 0.083% IN NEBU
INHALATION_SOLUTION | RESPIRATORY_TRACT | Status: AC
  Filled 2018-02-18: qty 9

## 2018-02-18 MED ORDER — FENTANYL 2500MCG IN NS 250ML (10MCG/ML) PREMIX INFUSION
25.0000 ug/h | INTRAVENOUS | Status: DC
  Administered 2018-02-18: 50 ug/h via INTRAVENOUS
  Filled 2018-02-18: qty 250

## 2018-02-18 MED ORDER — SODIUM BICARBONATE 8.4 % IV SOLN
200.0000 meq | Freq: Once | INTRAVENOUS | Status: AC
  Administered 2018-02-18: 200 meq via INTRAVENOUS
  Filled 2018-02-18: qty 200

## 2018-02-18 MED ORDER — SODIUM CHLORIDE 0.9% IV SOLUTION
Freq: Once | INTRAVENOUS | Status: AC
  Administered 2018-02-18: 10:00:00 via INTRAVENOUS

## 2018-02-18 MED ORDER — MIDAZOLAM HCL 2 MG/2ML IJ SOLN: 1.0000 mg | INTRAMUSCULAR | Status: DC | PRN

## 2018-02-18 MED ORDER — SODIUM POLYSTYRENE SULFONATE 15 GM/60ML PO SUSP: 45.0000 g | Freq: Once | ORAL | Status: DC

## 2018-02-18 MED ORDER — ALBUMIN HUMAN 25 % IV SOLN
50.0000 g | Freq: Once | INTRAVENOUS | Status: AC
  Administered 2018-02-18: 50 g via INTRAVENOUS
  Filled 2018-02-18: qty 200

## 2018-02-18 MED ORDER — ETOMIDATE 2 MG/ML IV SOLN
20.0000 mg | Freq: Once | INTRAVENOUS | Status: AC
  Administered 2018-02-18: 20 mg via INTRAVENOUS

## 2018-02-18 MED ORDER — EPINEPHRINE PF 1 MG/ML IJ SOLN
0.5000 ug/min | INTRAVENOUS | Status: DC
  Administered 2018-02-18: 0.5 ug/min via INTRAVENOUS
  Administered 2018-02-19 (×2): 20 ug/min via INTRAVENOUS
  Filled 2018-02-18 (×5): qty 4

## 2018-02-18 MED ORDER — SODIUM CHLORIDE 0.9 % IV SOLN
80.0000 mg | Freq: Once | INTRAVENOUS | Status: AC
  Administered 2018-02-18: 80 mg via INTRAVENOUS
  Filled 2018-02-18: qty 80

## 2018-02-18 MED ORDER — FENTANYL BOLUS VIA INFUSION
25.0000 ug | INTRAVENOUS | Status: DC | PRN
  Filled 2018-02-18: qty 25

## 2018-02-18 MED ORDER — FENTANYL CITRATE (PF) 100 MCG/2ML IJ SOLN: 100.0000 ug | Freq: Once | INTRAMUSCULAR | Status: DC

## 2018-02-18 MED ORDER — IPRATROPIUM-ALBUTEROL 0.5-2.5 (3) MG/3ML IN SOLN: 3.0000 mL | RESPIRATORY_TRACT | Status: DC | PRN

## 2018-02-18 MED ORDER — NOREPINEPHRINE 16 MG/250ML-% IV SOLN
0.0000 ug/min | INTRAVENOUS | Status: DC
  Administered 2018-02-18 – 2018-02-19 (×4): 40 ug/min via INTRAVENOUS
  Filled 2018-02-18 (×4): qty 250

## 2018-02-18 MED ORDER — INSULIN REGULAR HUMAN 100 UNIT/ML IJ SOLN
10.0000 [IU] | Freq: Once | INTRAMUSCULAR | Status: AC
  Administered 2018-02-18: 10 [IU] via INTRAVENOUS
  Filled 2018-02-18: qty 10

## 2018-02-18 MED ORDER — SODIUM CHLORIDE 0.9 % IV SOLN
8.0000 mg/h | INTRAVENOUS | Status: DC
  Administered 2018-02-18 (×2): 8 mg/h via INTRAVENOUS
  Filled 2018-02-18 (×2): qty 80

## 2018-02-18 MED ORDER — PROTHROMBIN COMPLEX CONC HUMAN 500 UNITS IV KIT
5000.0000 [IU] | PACK | Status: AC
  Administered 2018-02-18: 5000 [IU] via INTRAVENOUS
  Filled 2018-02-18: qty 2000

## 2018-02-18 MED ORDER — SODIUM BICARBONATE 8.4 % IV SOLN
INTRAVENOUS | Status: DC
  Administered 2018-02-18: via INTRAVENOUS
  Filled 2018-02-18 (×2): qty 200

## 2018-02-18 MED ORDER — HYDROCORTISONE NA SUCCINATE PF 100 MG IJ SOLR
100.0000 mg | Freq: Three times a day (TID) | INTRAMUSCULAR | Status: DC
  Administered 2018-02-18 – 2018-02-19 (×3): 100 mg via INTRAVENOUS
  Filled 2018-02-18 (×3): qty 2

## 2018-02-18 MED ORDER — SODIUM CHLORIDE 0.9 % IV SOLN
0.0000 ug/min | INTRAVENOUS | Status: DC
  Administered 2018-02-18: 260 ug/min via INTRAVENOUS
  Administered 2018-02-18: 20 ug/min via INTRAVENOUS
  Administered 2018-02-18: 350 ug/min via INTRAVENOUS
  Administered 2018-02-18: 400 ug/min via INTRAVENOUS
  Administered 2018-02-18 (×2): 260 ug/min via INTRAVENOUS
  Filled 2018-02-18: qty 1
  Filled 2018-02-18: qty 10
  Filled 2018-02-18: qty 1
  Filled 2018-02-18: qty 10
  Filled 2018-02-18 (×3): qty 1

## 2018-02-18 MED ORDER — FENTANYL CITRATE (PF) 100 MCG/2ML IJ SOLN
50.0000 ug | Freq: Once | INTRAMUSCULAR | Status: AC
  Administered 2018-02-18: 50 ug via INTRAVENOUS

## 2018-02-18 MED ORDER — SODIUM CHLORIDE 0.9 % IV SOLN
0.0000 ug/min | INTRAVENOUS | Status: DC
  Administered 2018-02-18 – 2018-02-19 (×16): 400 ug/min via INTRAVENOUS
  Filled 2018-02-18: qty 4
  Filled 2018-02-18 (×3): qty 40
  Filled 2018-02-18 (×2): qty 4
  Filled 2018-02-18 (×3): qty 40
  Filled 2018-02-18 (×6): qty 4
  Filled 2018-02-18: qty 40
  Filled 2018-02-18: qty 4

## 2018-02-18 MED ORDER — CALCIUM GLUCONATE-NACL 1-0.675 GM/50ML-% IV SOLN
1.0000 g | Freq: Once | INTRAVENOUS | Status: AC
  Administered 2018-02-18: 1000 mg via INTRAVENOUS
  Filled 2018-02-18: qty 50

## 2018-02-18 MED ORDER — HEPARIN SODIUM (PORCINE) 1000 UNIT/ML DIALYSIS
1000.0000 [IU] | INTRAMUSCULAR | Status: DC | PRN
  Filled 2018-02-18: qty 6

## 2018-02-18 MED ORDER — CHLORHEXIDINE GLUCONATE 0.12% ORAL RINSE (MEDLINE KIT)
15.0000 mL | Freq: Two times a day (BID) | OROMUCOSAL | Status: DC
  Administered 2018-02-18 (×2): 15 mL via OROMUCOSAL

## 2018-02-18 MED ORDER — SODIUM BICARBONATE 8.4 % IV SOLN
INTRAVENOUS | Status: DC
  Administered 2018-02-18 (×2): via INTRAVENOUS
  Filled 2018-02-18 (×2): qty 150

## 2018-02-18 MED ORDER — ROCURONIUM BROMIDE 50 MG/5ML IV SOLN
50.0000 mg | Freq: Once | INTRAVENOUS | Status: AC
  Administered 2018-02-18: 50 mg via INTRAVENOUS

## 2018-02-18 MED ORDER — INSULIN ASPART 100 UNIT/ML ~~LOC~~ SOLN
0.0000 [IU] | SUBCUTANEOUS | Status: DC
  Administered 2018-02-18: 1 [IU] via SUBCUTANEOUS
  Administered 2018-02-19: 2 [IU] via SUBCUTANEOUS
  Filled 2018-02-18 (×2): qty 1

## 2018-02-18 MED ORDER — PHENYLEPHRINE HCL-NACL 10-0.9 MG/250ML-% IV SOLN: 0.0000 ug/min | INTRAVENOUS | Status: DC

## 2018-02-18 MED ORDER — SODIUM BICARBONATE 8.4 % IV SOLN
100.0000 meq | Freq: Once | INTRAVENOUS | Status: AC
  Administered 2018-02-18: 100 meq via INTRAVENOUS
  Filled 2018-02-18: qty 100

## 2018-02-18 NOTE — Care Management (Signed)
Patient required transfer to ICU this morning and intubated. Vomited 900 cc of dark blood during intubation. On IV pressors. To start CRRT through internal jugular catheter.

## 2018-02-18 NOTE — Progress Notes (Addendum)
Name: Crystal Pacheco MRN: [id number] DOB: [birth year]     CONSULTATION DATE: 01/29/2018  Subjective and objectives: Patient was transferred back to ICU with worsening renal failure, hypotension and altered mental status. Obtunded, on BiPAP and Neosynephrine.  PAST MEDICAL HISTORY :   has a past medical history of Anemia, Anxiety, Cerebral infarction ([place]) (2006), Chronic combined systolic (congestive) and diastolic (congestive) heart failure ([place]), COPD (chronic obstructive pulmonary disease) ([place]), Coronary artery disease (2006), Essential hypertension, GERD (gastroesophageal reflux disease), MI (myocardial infarction) ([place]) (2006), Persistent atrial fibrillation, and Type II diabetes mellitus ([place]).  has a past surgical history that includes NM MYOVIEW LTD; Cardiac catheterization; Coronary angioplasty with stent (02/10/2005); Carotid surgery stent; TEE without cardioversion (N/A, 01/28/2018); and CARDIOVERSION (N/A, 02/10/2018). Prior to Admission medications   Medication Sig Start Date End Date Taking? Authorizing Provider  allopurinol (ZYLOPRIM) 100 MG tablet Take 1 tablet (100 mg total) by mouth daily. 05/20/17  Yes Birdie Sons, MD  colchicine 0.6 MG tablet Take 2 tablets today, then one daily until gout is gone Patient taking differently: Take 0.6 mg by mouth daily as needed (for gout flares).  05/19/17  Yes Birdie Sons, MD  diphenhydrAMINE (BENADRYL) 25 mg capsule Take 25 mg by mouth 2 (two) times daily.   Yes [provider]  metFORMIN (GLUCOPHAGE-XR) 500 MG 24 hr tablet TAKE 1 TABLET BY MOUTH EVERY DAY WITH BREAKFAST Patient taking differently: Take 500 mg by mouth 3 (three) times a week. TAKE 1 TABLET BY MOUTH EVERY DAY WITH BREAKFAST 05/19/17  Yes Birdie Sons, MD  metoprolol tartrate (LOPRESSOR) 25 MG tablet Take 2 tablets (50 mg total) by mouth 2 (two) times daily. Patient taking differently: Take 50 mg by mouth daily.  01/31/18  Yes Fisher, Kirstie Peri, MD    NITROSTAT 0.4 MG SL tablet DISSOLVE 1 TABLET UNDER TONGUE EVERY 3 TO 5 MINUTES AS NEEDED FOR CHEST PAIN 11/12/14  Yes Birdie Sons, MD  omeprazole (PRILOSEC) 20 MG capsule TAKE 1 CAPSULE BY MOUTH TWO TIMES DAILY Patient taking differently: Take 20 mg by mouth 2 (two) times daily as needed (for acid reflux/indigestion.).  05/21/17  Yes Birdie Sons, MD  potassium chloride SA (K-DUR,KLOR-CON) 20 MEQ tablet Take 1 tablet (20 mEq total) by mouth daily. 02/03/18  Yes Birdie Sons, MD  rivaroxaban (XARELTO) 20 MG TABS tablet Take 1 tablet (20 mg total) by mouth daily with supper. 02/10/18  Yes Birdie Sons, MD  torsemide (DEMADEX) 100 MG tablet Take 1 tablet (100 mg total) by mouth daily. 01/31/18  Yes Birdie Sons, MD  ALPRAZolam Duanne Moron) 0.25 MG tablet TAKE 1/2-1 TABLET BY MOUTH TWICE DAILY AS NEEDED Patient not taking: Reported on 02/12/2018 05/21/17   Birdie Sons, MD  atorvastatin (LIPITOR) 80 MG tablet TAKE 1 TABLET EVERY DAY Patient not taking: Reported on 01/23/2018 05/20/17   Birdie Sons, MD  glucose blood test strip Use as instructed 11/14/14   Birdie Sons, MD   No Known Allergies  FAMILY HISTORY:  family history includes CAD in her mother. SOCIAL HISTORY:  reports that she quit smoking about 10 years ago. Her smoking use included cigarettes. She has a 20.00 pack-year smoking history. She has never used smokeless tobacco. She reports that she drinks about 2.0 standard drinks of alcohol per week. She reports that she does not use drugs.  REVIEW OF SYSTEMS:   Unable to obtain due to critical illness   VITAL SIGNS: Temp:  [  97.6 F (36.4 C)-98.4 F (36.9 C)] 97.7 F (36.5 C) (11/29 0004) Pulse Rate:  [27-134] 98 (11/29 0345) Resp:  [17-28] 21 (11/29 0415) BP: (53-122)/(24-100) 61/37 (11/29 0415) SpO2:  [68 %-100 %] 81 % (11/29 0345) FiO2 (%):  [70 %] 70 % (11/29 0350) Weight:  [111.4 kg-111.6 kg] 111.4 kg (11/29 0233)  Physical Examination:  Obtunded,  moving all extremities and not following commands On a BiPAP, mild respiratory distress, bilateral equal air entry with no adventitious sounds S1 and S2 are audible with no murmur Benign obese abdomen with feeble peristalsis Wasted extremities with no peripheral edema  ASSESSMENT / PLAN:  *Acute respiratory failure with mild respiratory distress *COPD *Pneumonia with aspiration. Bibasilar airspace diease Lt. Rt MRSA PCR -ve  *CT CHEST 11/26: Bil airspace disease with bil. pl. Eff. Questionable Lt hilar mass  And possible multiple liver lesions with concern for malignancy -Monitor ABG and optimize vent settings -Bronchodilators, Cefep + Flagyl -CT chest with iv contrast with improvement of renal function.  *Altered mental status with toxic metabolic encephalopathy.  -Monitor neuro status and CT head when stable  AKI, hyperkalemia, worsening metabolic acidosis with anuria due to ATN -Plan to start on RRT as per renal -Temporizing agent for hyperkalemia -Bicarb gtt -Monitor renal panel  *A. fib with RVR. Echo 02/07/2018 LVEF 45 to 50%.Patient was scheduled to have TEE/DCCV on 02/15/2018 and has set on holdbecause of serum creatinine 6.87GFR 6.  *Coronary artery disease, hypertension *CHF with bilateral leg edema. Abdomen LVEF 45 to 50% -Optimize antihypertensives, ASA + Statin + BB as tolerated and monitor hemodynamics -Optimize rate control -Management as per cardiology  *Sepsis with septic shock. Procal 4.31, LA 9.9 -Cefep + Falgyl , Follow with culture  -Monitor hemodynamics, cultures and Procalcitonin  *GI bleed.  700 of coffee-ground aspirate via orogastric *Elevated LFT with acute ischemic hepatitis -PPI -GI consult  *Anemia with blood loss -Maintain hemoglobin more than 9 g/dL.  Liberal transfusion policy was active GI bleed and hypotension  *Coagulopathy due to Xarelto. Eppie Gibson for starting procedure (placement of dialysis access)  *Diabetes mellitus. HB A  1 C 7.8 -Glycemic control  *Gouty arthritis and limited physical capacity being only able to go to the bathroom -Allopurinol and optimize analgesia  Full code  DVT & GI prophylaxis.  Continue with supportive care Mr. Milham was updated at the bedside and he agreed to the plan of care  Critical care time 45 minutes  Patient condition continued to deteriorate. *Septic/cardiogenic shock -Optimize pressors and monitor hemodynamics -Continue with antibiotics, monitor cultures and procalcitonin  Altered mental status progressed to coma *Monitor neuro status and CT head when hemodynamically stable  DIC picture *Transfuse blood product, monitor fibrinogen degradation products, continue with the steroids and consider hematology consult.  Family was updated with the patient condition in the bedside

## 2018-02-18 NOTE — Progress Notes (Signed)
[name] Progress Note Patient Name: Crystal Pacheco DOB: Aug 19, 1950 MRN: [id number]   Date of Service  02/18/2018  HPI/Events of Note  Worsening metabolic acidosis with pH 6.95/33/47/7.3 on CRRT and bicarb gtt.  Maxed out on 4 pressors  eICU Interventions  Plan: 1. Increase vent rate to 30 2. 4 amps of bicarb IVP 3. Increase Bicarb to 200 cc/hr 4. Nurse to contact nephrology for additional recommendations related to CRRT adjustments for ongoing acidosis.     Intervention Category Major Interventions: Acid-Base disturbance - evaluation and management;Shock - evaluation and management  DETERDING,ELIZABETH 02/18/2018, 9:54 PM

## 2018-02-18 NOTE — Progress Notes (Signed)
Returned and started an US guided IV in the right upper arm.

## 2018-02-18 NOTE — Progress Notes (Signed)
I was asked to get a second IV site and attempted X3 with [place] and I couldn't find another vein to stick. RN notified.

## 2018-02-18 NOTE — Progress Notes (Signed)
Admitted from 2A. Patient mottled to lower extremities and cyanotic nailbeds to upper and lower extremities. Difficult to obtain pulse ox even on forehead. Blood pressure 70 systolic until blood pressure cuff changed then in the 110s. RT in room to obtain abg and Lab in the room at this time. Patient confused and was asking why she was in the hall and not wearing clothes. Had to reorient several times upon arrival. NP in room to assess patient.

## 2018-02-18 NOTE — Progress Notes (Signed)
   02/18/18 0300  Clinical Encounter Type  Visited With Family;Patient not available  Visit Type Initial;Critical Care  Referral From Nurse  Spiritual Encounters  Spiritual Needs Emotional  CH offered support to patient husband; Husband indicated no more support needed at this time; [facility] available if needed. Gwynn Burly 3:49 AM

## 2018-02-18 NOTE — Progress Notes (Signed)
   02/18/18 1050  Clinical Encounter Type  Visited With Family  Visit Type Follow-up  Referral From Nurse   Based on last night's on-call chaplain report and unit staff request, this chaplain followed up with patient's spouse.  Overview of chaplain availability.  Spouse (Tom) indicated that he would be stepping out for a short time but was open to chaplain support.  Chaplain offered silent and energetic prayers at patient's bedside.

## 2018-02-18 NOTE — Progress Notes (Signed)
IV team consulted for difficult stick.  IV team unsuccessful with new site.  MD made aware.  Unable to start central line due to elevated INR at this time. Will administer IVFs and antibiotics when FFP completed.

## 2018-02-18 NOTE — Progress Notes (Signed)
Pt with undocumented coolness and mottling to lower extremities.  Oxygen applied by previous shift due to hypoxia. Saturations WNL on [facility] but periods of confusion noted.  Dr Jannifer Franklin in to assess.

## 2018-02-18 NOTE — Progress Notes (Signed)
Both ED charge nurse and IV team nurse arrived at room to start new IV sites. Both RNs were able to obtain access. Patient on bipap and will have to be intubated. Intensivist on the way to put in tube.

## 2018-02-18 NOTE — Procedures (Signed)
Endotracheal Intubation Procedure Note  Indication for endotracheal intubation: airway compromise and respiratory failure. Airway Assessment: Mallampati Class: II (hard and soft palate, upper portion of tonsils anduvula visible). Sedation: etomidate and fentanyl. Paralytic: succinylcholine. Lidocaine: no. Atropine: no. Equipment: Glide scope, 7.5 Fr ETT Cricoid Pressure: no. Number of attempts: 1. ETT location confirmed by by auscultation and ETCO2 monitor. Patient vomited, 800 ml of coffee ground was suctioned from hypopharynx.  during intubation Rudean Icenhour 02/18/2018

## 2018-02-18 NOTE — Progress Notes (Signed)
CCU Charge RN in to see and assess for bed assignment.

## 2018-02-18 NOTE — Progress Notes (Signed)
Progress Note  Patient Name: Crystal Pacheco Date of Encounter: 02/18/2018  Primary Cardiologist: Ida Rogue, MD   Subjective   The plan was to proceed with dialysis yesterday but vascular access could not be placed due to coagulopathic.  The patient was given vitamin K and FFP.  However, she decompensated early morning with confusion, hypoxia and hypotension.  During intubation, she vomited 900 mL of dark blood.  She is very hypotensive on 3 pressors.  Inpatient Medications    Scheduled Meds: . sodium chloride   Intravenous Once  . sodium chloride   Intravenous Once  . albuterol      . aspirin EC  81 mg Oral Daily  . chlorhexidine gluconate (MEDLINE KIT)  15 mL Mouth Rinse BID  . Chlorhexidine Gluconate Cloth  6 each Topical Q0600  . docusate sodium  100 mg Oral BID  . feeding supplement (NEPRO CARB STEADY)  237 mL Oral BID BM  . fentaNYL (SUBLIMAZE) injection  100 mcg Intravenous Once  . hydrocortisone sod succinate (SOLU-CORTEF) inj  100 mg Intravenous Q8H  . insulin aspart  0-5 Units Subcutaneous QHS  . insulin aspart  0-9 Units Subcutaneous TID WC  . mouth rinse  15 mL Mouth Rinse 10 times per day  . metoprolol tartrate  37.5 mg Oral Q6H  . multivitamin with minerals  1 tablet Oral Daily  . [START ON 02/21/2018] pantoprazole  40 mg Intravenous Q12H  . patiromer  8.4 g Oral Daily  . rosuvastatin  10 mg Oral q1800   Continuous Infusions: . sodium chloride 10 mL/hr at 02/18/18 0009  . albumin human    . ceFEPime (MAXIPIME) IV Stopped (02/17/18 1801)  . fentaNYL infusion INTRAVENOUS 50 mcg/hr (02/18/18 0801)  . metronidazole 100 mL/hr at 02/18/18 0801  . norepinephrine (LEVOPHED) Adult infusion 40 mcg/min (02/18/18 0754)  . pantoprozole (PROTONIX) infusion 8 mg/hr (02/18/18 0911)  . phenylephrine (NEO-SYNEPHRINE) Adult infusion 400 mcg/min (02/18/18 0900)  . pureflow    .  sodium bicarbonate  infusion 1000 mL 75 mL/hr at 02/18/18 0801  . vasopressin (PITRESSIN)  infusion - *FOR SHOCK* 0.03 Units/min (02/18/18 0913)   PRN Meds: acetaminophen **OR** acetaminophen, bisacodyl, fentaNYL, heparin, ipratropium-albuterol, midazolam, midazolam, ondansetron **OR** ondansetron (ZOFRAN) IV, polyvinyl alcohol, promethazine, traZODone   Vital Signs    Vitals:   02/18/18 0800 02/18/18 0810 02/18/18 0820 02/18/18 0830  BP: (!) 57/49 (!) 41/25 (!) 57/25   Pulse:      Resp: 19 (!) 21 20 19   Temp:      TempSrc:      SpO2:      Weight:      Height:        Intake/Output Summary (Last 24 hours) at 02/18/2018 0925 Last data filed at 02/18/2018 0801 Gross per 24 hour  Intake 3071.05 ml  Output -  Net 3071.05 ml   Filed Weights   02/17/18 0439 02/18/18 0038 02/18/18 0233  Weight: 111.6 kg 111.4 kg 111.4 kg    Telemetry    Atrial fibrillation with between 100 to 130 bpm- Personally Reviewed  ECG     - Personally Reviewed  Physical Exam   GEN:  Acutely ill.  Intubated and sedated Neck:  Significant JVD Cardiac:  Irregularly irregular mildly tachycardic, no murmurs, rubs, or gallops.  Respiratory:  Bibasilar crackles GI: Soft, nontender, non-distended  MS:  +3 edema; No deformity. Neuro:  Nonfocal  Psych: Normal affect   Labs    Chemistry Recent Labs  Lab 02/16/18  3875  02/17/18 2148 02/18/18 0332 02/18/18 0740  NA 132*   < > 129* 130* 133*  K 5.1   < > 6.0* 6.7* 5.6*  CL 91*   < > 87* 90* 92*  CO2 20*   < > 18* 12* 13*  GLUCOSE 142*   < > 163* 125* 137*  BUN 107*   < > 137* 129* 125*  CREATININE 7.48*   < > 8.86* 9.02* 8.69*  CALCIUM 8.6*   < > 9.3 9.1 8.4*  PROT 6.4*  --  7.1  --   --   ALBUMIN 2.5*  --  2.7*  --   --   AST 20  --  125*  --   --   ALT 13  --  31  --   --   ALKPHOS 95  --  203*  --   --   BILITOT 1.9*  --  3.1*  --   --   GFRNONAA 5*   < > 4* 4* 4*  GFRAA 6*   < > 5* 5* 5*  ANIONGAP 21*   < > 24* 28* 28*   < > = values in this interval not displayed.     Hematology Recent Labs  Lab 02/17/18 2148  02/18/18 0332 02/18/18 0539 02/18/18 0740  WBC 24.6* 24.9*  --  23.7*  RBC 4.72 4.45  --  3.20*  HGB 13.8 13.0 9.5* 9.5*  HCT 44.0 43.6 31.8* 32.7*  MCV 93.2 98.0  --  102.2*  MCH 29.2 29.2  --  29.7  MCHC 31.4 29.8*  --  29.1*  RDW 15.7* 15.7*  --  15.7*  PLT 304 240  --  154    Cardiac EnzymesNo results for input(s): TROPONINI in the last 168 hours. No results for input(s): TROPIPOC in the last 168 hours.   BNP No results for input(s): BNP, PROBNP in the last 168 hours.   DDimer No results for input(s): DDIMER in the last 168 hours.   Radiology    Dg Chest 1 View  Result Date: 02/18/2018 CLINICAL DATA:  Hypoxia.  Central catheter placement EXAM: CHEST  1 VIEW COMPARISON:  February 18, 2018 study obtained earlier in the day. FINDINGS: Endotracheal tube tip is 2.3 cm above the carina. Central catheter tip is in the superior vena cava near the cavoatrial junction. Nasogastric tube tip and side port are below the diaphragm. No pneumothorax. There is consolidation throughout much of the left mid and lower lung zones with left pleural effusion. There is a small right pleural effusion with right base atelectasis. Heart is mildly enlarged with pulmonary vascularity normal. No adenopathy. There is aortic atherosclerosis. No bone lesions. IMPRESSION: Tube and catheter positions as described without pneumothorax. Central catheter in particular has its tip in the superior vena cava near the cavoatrial junction. Persistent consolidation throughout much of the left mid and lower lung zones with left pleural effusion. Small right pleural effusion with right base atelectasis. No new opacity evident. Stable cardiac silhouette. There is aortic atherosclerosis. Aortic Atherosclerosis (ICD10-I70.0). Electronically Signed   By: Lowella Grip III M.D.   On: 02/18/2018 07:36   Dg Abd 1 View  Result Date: 02/18/2018 CLINICAL DATA:  OG tube placement. EXAM: ABDOMEN - 1 VIEW COMPARISON:  Radiographs  earlier this day. FINDINGS: Tip and side port of the enteric tube below the diaphragm in the stomach. Decreased gaseous gastric distension from earlier this day. Persistent air-filled transverse colon and bowel loops in the  lower abdomen. IMPRESSION: Tip and side port of the enteric tube below the diaphragm in the stomach. Electronically Signed   By: Keith Rake M.D.   On: 02/18/2018 04:36   Dg Abd 1 View  Result Date: 02/18/2018 CLINICAL DATA:  Abdominal pain. EXAM: ABDOMEN - 1 VIEW COMPARISON:  None. FINDINGS: Gaseous gastric distension. Gaseous distension of transverse colon, with air-filled bowel in the lower abdomen, colon versus small bowel. No evidence of free air. Soft tissue attenuation from habitus limits assessment. IMPRESSION: Gaseous gastric distension. Gaseous distension of transverse colon, with air-filled bowel in the lower abdomen, colon versus small bowel. Findings suggest generalized ileus. Electronically Signed   By: Keith Rake M.D.   On: 02/18/2018 02:56   Dg Chest Port 1 View  Result Date: 02/18/2018 CLINICAL DATA:  Status post intubation. EXAM: PORTABLE CHEST 1 VIEW COMPARISON:  Radiograph yesterday. FINDINGS: Endotracheal tube tip 2.6 cm from the carina. Enteric tube in place with tip and side-port below the diaphragm. Progressive rotation from prior exam. Persistent opacification of lower 1/2 of left hemithorax. Worsening right infrahilar opacities. Mediastinal contours and heart size are obscured by adjacent pleural effusion and rotation. IMPRESSION: 1. Endotracheal tube tip 2.6 cm from the carina. Tip and side port of the enteric tube below the diaphragm. 2. Rotated exam with unchanged left lung base opacity and pleural effusion. Worsening right infrahilar opacities may be atelectasis, pneumonia, or aspiration in the appropriate clinical setting. Electronically Signed   By: Keith Rake M.D.   On: 02/18/2018 04:35   Dg Chest Port 1 View  Result Date:  02/17/2018 CLINICAL DATA:  Cough. EXAM: PORTABLE CHEST 1 VIEW COMPARISON:  Radiograph yesterday. CT 2 days ago. FINDINGS: Unchanged opacification of lower left hemithorax, combination of consolidation and pleural effusion. Left upper lobe and right infrahilar opacities are unchanged. Unchanged slight improvement in right pleural effusion, size not well characterized. No new airspace disease or pneumothorax. Unchanged heart size and mediastinal contours. Aortic atherosclerosis. IMPRESSION: 1. Unchanged opacification of lower left hemithorax, combination of consolidation and pleural effusion. 2. Unchanged left upper lobe and right infrahilar opacities. 3.  Aortic Atherosclerosis (ICD10-I70.0). Electronically Signed   By: Keith Rake M.D.   On: 02/17/2018 21:17    Cardiac Studies   02/07/2018 TTE Study Conclusions - Left ventricle: The cavity size was normal. Wall thickness was normal. Systolic function was mildly reduced. The estimated ejection fraction was in the range of 45% to 50%. - Aortic valve: Valve area (VTI): 0.98 cm^2. Valve area (Vmax): 1.54 cm^2. Valve area (Vmean): 1.37 cm^2. - Mitral valve: There was mild to moderate regurgitation.  Patient Profile     67 y.o. female with a h/o Afib with RVR, CAD, chronic combined systolic and diastolic heart failure, HTN, HLD, remote tobacco abuse, COPD, prior CVA, and DM2 seen for the management of Afib with RVR s/p TEE/DCCV cancellation d/t renal failure.  Assessment & Plan    1.  Acute renal failure: ATN from overdiuresis.  Still with no significant urine output and now with significant acidosis and uremia with shock.  I discussed with Dr. Candiss Norse and recommend emergent CRRT.  1.  Atrial fibrillation with rapid ventricular response: Ventricular rate in the low 100 for now.  Hold metoprolol due to hypotension.  She is still coagulopathic from the effect of Xarelto.  Consider reversing this given GI bleed. If ventricular rate becomes  high, amiodarone drip can be considered for rate control.  3.  Coronary artery disease: Currently with no anginal  symptoms.  Continue low-dose aspirin, metoprolol and rosuvastatin.  4.  Chronic systolic heart failure: EF was 45 to 50% by echo likely due to tachycardia induced cardiomyopathy.  She is significantly volume overloaded due to anuric acute renal failure.  Dialysis to be started today.  5.  GI bleed: Recommend transfusion given that she is hypotensive and in order to support her hemodynamics during dialysis.  The patient is critically ill with risk of cardiopulmonary arrest at any moment.  I discussed with Dr. Candiss Norse and Dr. Soyla Murphy      For questions or updates, please contact [facility] Please consult [email or web address] for contact info under        Signed, Kathlyn Sacramento, MD  02/18/2018, 9:25 AM

## 2018-02-18 NOTE — Progress Notes (Signed)
FFP completed.  Patient extremities cooler to touch, B/P dropping to &0s.  MD made aware.  Orders given for transfer to stepdown.

## 2018-02-18 NOTE — Progress Notes (Signed)
[facility] [place] NAME: Crystal Pacheco    MR#:  [id number]  DATE OF BIRTH:  05/09/1950  SUBJECTIVE:  Patient seen and evaluated today Patient had respiratory distress and an episode of GI bleed yesterday night Last night patient was intubated and moved to ICU Has severe hyperkalemia and acidosis IJ access has been placed Patient received FFP and vitamin K yesterday Due for CRRT today Unable to give any history REVIEW OF SYSTEMS:  Could not be obtained Patient critically ill on ventilator  DRUG ALLERGIES:  No Known Allergies VITALS:  Blood pressure (!) 57/25, pulse (!) 38, temperature 97.7 F (36.5 C), temperature source Axillary, resp. rate 19, height 5\' 4"  (1.626 m), weight 111.4 kg, SpO2 (!) 67 %. PHYSICAL EXAMINATION:   GENERAL: 67 year old female patient lying on the bed on ventilator Ventilator settings Tidal volume 450 Rate 16 PEEP 5 FiO2 60% HEAD, EYES, EARS, NOSE AND THROAT: Atraumatic, normocephalic. Extraocular muscles are intact. Pupils equal and reactive to light and accommodation sluggishly. Sclerae anicteric.  Oropharynx ET tube noted NECK: Supple. There is no jugular venous distention. No bruits, no lymphadenopathy, no thyromegaly.  HEART: S1-S2 regular. No murmurs LUNGS: On ventilator Decreased air movement in both lungs Rales heard in both lungs ABDOMEN: Soft, flat, nontender, nondistended. Has good bowel sounds. No hepatosplenomegaly appreciated.  EXTREMITIES: No evidence of any cyanosis, clubbing, .  +2 pedal and radial pulses bilaterally.  Has pedal edema NEUROLOGIC: Not oriented to time place and person On ventilator SKIN: Moist and warm with no rashes appreciated.  LYMPHATIC: No cervical or axillary lymphadenopathy.   LABORATORY PANEL:  Female CBC Recent Labs  Lab 02/18/18 0740  WBC 23.7*  HGB 9.5*  HCT 32.7*  PLT 154    ------------------------------------------------------------------------------------------------------------------ Chemistries  Recent Labs  Lab 02/17/18 2148  02/18/18 0740  NA 129*   < > 133*  K 6.0*   < > 5.6*  CL 87*   < > 92*  CO2 18*   < > 13*  GLUCOSE 163*   < > 137*  BUN 137*   < > 125*  CREATININE 8.86*   < > 8.69*  CALCIUM 9.3   < > 8.4*  MG  --   --  3.2*  AST 125*  --   --   ALT 31  --   --   ALKPHOS 203*  --   --   BILITOT 3.1*  --   --    < > = values in this interval not displayed.   RADIOLOGY:  Dg Chest 1 View  Result Date: 02/18/2018 CLINICAL DATA:  Hypoxia.  Central catheter placement EXAM: CHEST  1 VIEW COMPARISON:  February 18, 2018 study obtained earlier in the day. FINDINGS: Endotracheal tube tip is 2.3 cm above the carina. Central catheter tip is in the superior vena cava near the cavoatrial junction. Nasogastric tube tip and side port are below the diaphragm. No pneumothorax. There is consolidation throughout much of the left mid and lower lung zones with left pleural effusion. There is a small right pleural effusion with right base atelectasis. Heart is mildly enlarged with pulmonary vascularity normal. No adenopathy. There is aortic atherosclerosis. No bone lesions. IMPRESSION: Tube and catheter positions as described without pneumothorax. Central catheter in particular has its tip in the superior vena cava near the cavoatrial junction. Persistent consolidation throughout much of the left mid and lower lung zones with left pleural effusion. Small right pleural effusion  with right base atelectasis. No new opacity evident. Stable cardiac silhouette. There is aortic atherosclerosis. Aortic Atherosclerosis (ICD10-I70.0). Electronically Signed   By: Lowella Grip III M.D.   On: 02/18/2018 07:36   Dg Abd 1 View  Result Date: 02/18/2018 CLINICAL DATA:  OG tube placement. EXAM: ABDOMEN - 1 VIEW COMPARISON:  Radiographs earlier this day. FINDINGS: Tip and side  port of the enteric tube below the diaphragm in the stomach. Decreased gaseous gastric distension from earlier this day. Persistent air-filled transverse colon and bowel loops in the lower abdomen. IMPRESSION: Tip and side port of the enteric tube below the diaphragm in the stomach. Electronically Signed   By: Keith Rake M.D.   On: 02/18/2018 04:36   Dg Abd 1 View  Result Date: 02/18/2018 CLINICAL DATA:  Abdominal pain. EXAM: ABDOMEN - 1 VIEW COMPARISON:  None. FINDINGS: Gaseous gastric distension. Gaseous distension of transverse colon, with air-filled bowel in the lower abdomen, colon versus small bowel. No evidence of free air. Soft tissue attenuation from habitus limits assessment. IMPRESSION: Gaseous gastric distension. Gaseous distension of transverse colon, with air-filled bowel in the lower abdomen, colon versus small bowel. Findings suggest generalized ileus. Electronically Signed   By: Keith Rake M.D.   On: 02/18/2018 02:56   Dg Chest Port 1 View  Result Date: 02/18/2018 CLINICAL DATA:  Status post intubation. EXAM: PORTABLE CHEST 1 VIEW COMPARISON:  Radiograph yesterday. FINDINGS: Endotracheal tube tip 2.6 cm from the carina. Enteric tube in place with tip and side-port below the diaphragm. Progressive rotation from prior exam. Persistent opacification of lower 1/2 of left hemithorax. Worsening right infrahilar opacities. Mediastinal contours and heart size are obscured by adjacent pleural effusion and rotation. IMPRESSION: 1. Endotracheal tube tip 2.6 cm from the carina. Tip and side port of the enteric tube below the diaphragm. 2. Rotated exam with unchanged left lung base opacity and pleural effusion. Worsening right infrahilar opacities may be atelectasis, pneumonia, or aspiration in the appropriate clinical setting. Electronically Signed   By: Keith Rake M.D.   On: 02/18/2018 04:35   Dg Chest Port 1 View  Result Date: 02/17/2018 CLINICAL DATA:  Cough. EXAM: PORTABLE  CHEST 1 VIEW COMPARISON:  Radiograph yesterday. CT 2 days ago. FINDINGS: Unchanged opacification of lower left hemithorax, combination of consolidation and pleural effusion. Left upper lobe and right infrahilar opacities are unchanged. Unchanged slight improvement in right pleural effusion, size not well characterized. No new airspace disease or pneumothorax. Unchanged heart size and mediastinal contours. Aortic atherosclerosis. IMPRESSION: 1. Unchanged opacification of lower left hemithorax, combination of consolidation and pleural effusion. 2. Unchanged left upper lobe and right infrahilar opacities. 3.  Aortic Atherosclerosis (ICD10-I70.0). Electronically Signed   By: Keith Rake M.D.   On: 02/17/2018 21:17   ASSESSMENT AND PLAN:  67 year old female patient under hospitalist service for renal failure  *Acute renal failure  - likely ATN -IJ access has been obtained.  CRRT today - hold Diuretics and any nephrotoxic meds  *Severe hypotension Continue IV pressor medication Intensivist follow-up  * A.fib with RVR -Metoprolol on hold secondary to hypotension - Hold Xarelto. INR INR greater than 8 - Cardiology follow-up -If heart rate becomes uncontrolled consider amiodarone drip  *Supratherapeutic INR -Xarelto on hold -Vitamin K and FFP were given -Probably secondary to liver failure -Follow-up INR  * Chronic combined systolic and diastolic heart failure: EF 45-50% -Tachycardia induced cardiomyopathy -Currently volume overloaded will get dialysis today  *Acute GI bleed -PRBC transfusion to support blood pressure  recommended  * Diabetes mellitus type 2 - SSI for now, hold metformin in view of renal failure  * Nausea intractable probably secondary to uremia -CRRT today  *Guarded prognosis  All the records are reviewed and case discussed with Care Management/Social Worker. Management plans discussed with the patient, family and they are in agreement.  CODE STATUS: Full  Code  TOTAL TIME TAKING CARE OF THIS PATIENT: 38 minutes.   More than 50% of the time was spent in counseling/coordination of care: YES  POSSIBLE D/C IN 3-4 DAYS, DEPENDING ON CLINICAL CONDITION. And Cardiac, Nephro eval   Saundra Shelling M.D on 02/18/2018 at 10:03 AM  Between 7am to 6pm - Pager - [phone]  After 6pm go to [email or web address] - Proofreader  [facility]  Office  [phone]  CC: Primary care physician; Birdie Sons, MD  Note: This dictation was prepared with Dragon dictation along with smaller phrase technology. Any transcriptional errors that result from this process are unintentional.

## 2018-02-18 NOTE — Progress Notes (Signed)
Transferred to CCU 14 via bed.  Phone call made to husband [phone]) unanswered at this time.  0049: Phone call to husband.  He was made aware of transfer to ICU for closer monitoring.

## 2018-02-18 NOTE — Progress Notes (Signed)
[place] for CRRT medication management   No Known Allergies  Patient Measurements: Height: _0  (162.6 cm) Weight: 245 lb 9.5 oz (111.4 kg) IBW/kg (Calculated) : 54.7  Vital Signs: Temp: 90 F (32.2 C) (11/29 1540) Temp Source: Esophageal (11/29 1420) BP: 73/64 (11/29 1540) Pulse Rate: 40 (11/29 1050) Intake/Output from previous day: 11/28 0701 - 11/29 0700 In: 2465.9 [I.V.:1321.6; CHYIF:027; IV Piggyback:481.3] Out: -  Intake/Output from this shift: Total I/O In: 4460.2 [I.V.:3194.4; Blood:937; IV Piggyback:328.9] Out: 0  Estimated Creatinine Clearance: 11 mL/min (A) (by C-G formula based on SCr of 5.99 mg/dL (H)).   Medical History: Past Medical History:  Diagnosis Date  . Anemia   . Anxiety   . Cerebral infarction ([place]) 2006  . Chronic combined systolic (congestive) and diastolic (congestive) heart failure ([place])    a. 08/2005 MV: EF 42%; b. 01/2018 Echo: EF 45-50%. Mild to mod MR.  Marland Kitchen COPD (chronic obstructive pulmonary disease) ([place])   . Coronary artery disease 2006   a. 01/2005 PCI: DES->OM. RCA 100p. Complicated by groin PSA; b 08/2005 MV: EF 42%, anterior and high lateral scar w/ peri-inf ischemia.  . Essential hypertension   . GERD (gastroesophageal reflux disease)   . MI (myocardial infarction) ([place]) 2006  . Persistent atrial fibrillation    a. Dx 01/2018. CHA2DS2VASc = 8-->Xarelto.  . Type II diabetes mellitus ([place])     Medications:  Scheduled:  . sodium chloride   Intravenous Once  . albuterol      . chlorhexidine gluconate (MEDLINE KIT)  15 mL Mouth Rinse BID  . Chlorhexidine Gluconate Cloth  6 each Topical Q0600  . fentaNYL (SUBLIMAZE) injection  100 mcg Intravenous Once  . hydrocortisone sod succinate (SOLU-CORTEF) inj  100 mg Intravenous Q8H  . insulin aspart  0-9 Units Subcutaneous Q4H  . mouth rinse  15 mL Mouth Rinse 10 times per day  . [START ON 02/21/2018] pantoprazole  40 mg Intravenous Q12H    Infusions:  . sodium chloride 10 mL/hr at 02/18/18 0009  . anticoagulant sodium citrate    . ceFEPime (MAXIPIME) IV Stopped (02/17/18 1801)  . epinephrine 1.5 mcg/min (02/18/18 1535)  . fentaNYL infusion INTRAVENOUS Stopped (02/18/18 0846)  . metronidazole Stopped (02/18/18 7412)  . norepinephrine (LEVOPHED) Adult infusion 40 mcg/min (02/18/18 1535)  . pantoprozole (PROTONIX) infusion 8 mg/hr (02/18/18 0911)  . phenylephrine (NEO-SYNEPHRINE) Adult infusion 400 mcg/min (02/18/18 1535)  . pureflow 2,000 mL/hr at 02/18/18 1015  .  sodium bicarbonate  infusion 1000 mL 125 mL/hr at 02/18/18 1535  . vasopressin (PITRESSIN) infusion - *FOR SHOCK* 0.04 Units/min (02/18/18 1535)    Assessment: Pharmacy consulted for CRRT medication monitoring for 67 yo female ICU patient currently requiring CRRT,  mechanical ventilation, and four pressors.  Plan:  No adjustments outside of previously adjusted antibiotics warranted.   Pharmacy will continue to monitor and adjust per consult.   Renleigh Ouellet L 02/18/2018,4:00 PM

## 2018-02-18 NOTE — Progress Notes (Signed)
Pt has remained intubated and unresponsive this shift. Pt on 4 pressors at this time with soft BPs. Unable to obtain SpO2 reading this shift due to hypothermia and mottling of extremities. At times able to get a reading with poor pleth waveform with readings in the high 80s. CRRT started early in the shift and running without difficulties. Family has been at bedside throughout the shift and updated.

## 2018-02-18 NOTE — Progress Notes (Signed)
Patient received IV push bicarb. Pharmacy contacted to make bicarb gtt at 200 meq and Paged Dr. Candiss Norse from Nephrology and he returned the call and said the bicarb that was ordered by [facility] MD was the only recommendation he would do and it is being done. He stated that from a nephrology standpoint everything that could be done has been done. Continue to monitor. Spoke with family and husband stated he will make a decision about patient code status in the morning, he wanted to give her more time, and knows everything is being done for his wife and he was thankful. Receiving blood products, and now on first unit of prbcs that is going through the warmer. Daughter and grand daughter at bedside.

## 2018-02-18 NOTE — Progress Notes (Signed)
CDS reference number [id number]. Per Shanon Brow from [place], pt is a medical rule out for organ donation including tissue and eyes. CDS stated to call with cardiac time of death should pt expire.

## 2018-02-18 NOTE — Progress Notes (Signed)
K-Centtra consult  PT/INR's ordered post K-Centra per protocol.  Sim Boast, PharmD, BCPS  02/18/18 6:37 AM

## 2018-02-18 NOTE — Procedures (Signed)
Hemodialysis Catheter Insertion Procedure Note Crystal Pacheco [id number] 12/14/1950  Procedure: Insertion of Hemodialysis Catheter Indications: Hemodialysis  Procedure Details Consent: Risks of procedure as well as the alternatives and risks of each were explained to the (patient/caregiver).  Consent for procedure obtained.  Time Out: Verified patient identification, verified procedure, site/side was marked, verified correct patient position, special equipment/implants available, medications/allergies/relevent history reviewed, required imaging and test results available.  Performed  Maximum sterile technique was used including antiseptics, cap, gloves, gown, hand hygiene, mask and sheet.  Skin prep: Chlorhexidine; local anesthetic administered  A Trialysis HD catheter was placed in the right internal jugular vein using the Seldinger technique.  Evaluation Blood flow good Complications: No apparent complications Patient did tolerate procedure well. Chest X-ray ordered to verify placement.  CXR: pending.   Procedure performed under direct supervision of Dr. Soyla Murphy and with ultrasound guidance for real time vessel cannulation.     [name], AGACNP-BC [facility] Pager: [phone] Cell: [phone]  02/18/2018, 7:39 AM

## 2018-02-18 NOTE — Progress Notes (Signed)
[place] NOTE  Pharmacy Consult for [place] monitoring  Indication: GI Bleed and possible intracranial hemorrhage   No Known Allergies  Patient Measurements: Height: 5' 4"  (162.6 cm) Weight: 245 lb 9.5 oz (111.4 kg) IBW/kg (Calculated) : 54.7  Vital Signs: Temp: 89.1 F (31.7 C) (11/29 1415) Temp Source: Esophageal (11/29 1420) BP: 70/46 (11/29 1410) Pulse Rate: 40 (11/29 1050)  Labs: Recent Labs    02/17/18 2148 02/18/18 0108 02/18/18 0332 02/18/18 0539 02/18/18 0740 02/18/18 0931 02/18/18 1158 02/18/18 1305  HGB 13.8  --  13.0 9.5* 9.5*  --  7.7*  --   HCT 44.0  --  43.6 31.8* 32.7*  --  26.7*  --   PLT 304  --  240  --  154  --   --   --   APTT  --  39*  --   --   --   --   --   --   LABPROT  --  63.4* 70.8*  --  81.0*  --   --   --   INR  --  7.65* 8.82*  --  10.49*  --   --   --   CREATININE 8.86*  --  9.02*  --  8.69* 8.04*  --  5.99*    Estimated Creatinine Clearance: 11 mL/min (A) (by C-G formula based on SCr of 5.99 mg/dL (H)).   Medications:  Scheduled:  . sodium chloride   Intravenous Once  . albuterol      . aspirin EC  81 mg Oral Daily  . chlorhexidine gluconate (MEDLINE KIT)  15 mL Mouth Rinse BID  . Chlorhexidine Gluconate Cloth  6 each Topical Q0600  . docusate sodium  100 mg Oral BID  . feeding supplement (NEPRO CARB STEADY)  237 mL Oral BID BM  . fentaNYL (SUBLIMAZE) injection  100 mcg Intravenous Once  . hydrocortisone sod succinate (SOLU-CORTEF) inj  100 mg Intravenous Q8H  . insulin aspart  0-9 Units Subcutaneous Q4H  . mouth rinse  15 mL Mouth Rinse 10 times per day  . metoprolol tartrate  37.5 mg Oral Q6H  . multivitamin with minerals  1 tablet Oral Daily  . [START ON 02/21/2018] pantoprazole  40 mg Intravenous Q12H  . patiromer  8.4 g Oral Daily  . rosuvastatin  10 mg Oral q1800   Infusions:  . sodium chloride 10 mL/hr at 02/18/18 0009  . albumin human    . ceFEPime (MAXIPIME) IV Stopped (02/17/18 1801)  .  epinephrine 0.5 mcg/min (02/18/18 1400)  . fentaNYL infusion INTRAVENOUS Stopped (02/18/18 0846)  . metronidazole Stopped (02/18/18 0962)  . norepinephrine (LEVOPHED) Adult infusion 45 mcg/min (02/18/18 1240)  . pantoprozole (PROTONIX) infusion 8 mg/hr (02/18/18 0911)  . phenylephrine (NEO-SYNEPHRINE) Adult infusion 400 mcg/min (02/18/18 1424)  . phytonadione (VITAMIN K) IV    . prothrombin complex conc human (Kcentra) IVPB    . pureflow 2,000 mL/hr at 02/18/18 1015  .  sodium bicarbonate  infusion 1000 mL 125 mL/hr at 02/18/18 1400  . vasopressin (PITRESSIN) infusion - *FOR SHOCK* 0.04 Units/min (02/18/18 1400)    Assessment: Pharmacy consulted for post Bryan monitoring for 67 yo female ICU patient currently requiring CRRT,  mechanical ventilation, and four pressors. Patient admitted on 11/25 with acute renal failure. Patient on rivaroxaban 55m Qsupper as an outpatient. Patient has maintained elevated INR since admission and is now in the ICU with GI bleed and possible intracranial hemorrhage (patient too unstable to take to  CT).    Plan:  Patient received Kcentra 2200 units this am and phytonadione 52m. Patient has received 1 Packed Red Blood Cells and 2 units of FFP. INR continues to climb. CCM has asked for additional full dose Kcentra to be ordered due to INR, 10.49, and declining clinical status. Patient ordered max dose of 5000 units. Would not recommend patient receive additional Kcentra. Patient ordered additional phytonadione 154m   Will order INRs for 60 minute post infusion, then Q6hr x 4, then daily x 2.   Pharmacy will continue to monitor per consult.   Tadhg Eskew L 02/18/2018,2:32 PM

## 2018-02-18 NOTE — Progress Notes (Signed)
Patient intubated and central line placed. Has been poorly profusing and unable to maintain a sat on the monitor. On Levo and Neo for pressors. Husband available for consent and to provide information. Wants everything done at this time. Bicarb, NS and pressors infusing at this time. Discussed care with NP and Intensivist. Continue to monitor.

## 2018-02-18 NOTE — Progress Notes (Signed)
   02/18/18 1210  Clinical Encounter Type  Visited With Patient and family together  Visit Type Follow-up  Spiritual Encounters  Spiritual Needs Emotional   Chaplain followed up with patient's spouse Gershon Mussel, offering emotional support.  Unit chaplain will ask on-call chaplain to follow up with patient and spouse this evening.

## 2018-02-18 NOTE — Progress Notes (Signed)
Pharmacy Antibiotic Note  Crystal Pacheco is a 68 y.o. female admitted on 01/21/2018 with acute renal failure, atrial fibrillation, and combined heart failure. Patient's creatinine remains elevated. Patient readmitted to ICU on 11/29 and is currently requiring CRRT, mechanical ventilation, and four pressors.  Pharmacy has been consulted for cefepime and vancomycin dosing for pneumonia.   Plan: Will transition patient to cefepime 2g IV Q12hr while on CRRT.   Will continue vancomycin 1g IV Q24hr for goal trough of 15-25 while on CRRT. Will obtain trough prior to dose on 12/1 to help guide therapy.   Metronidazole 500mg  IV Q8hr added on 11/29 to cover possible aspiration pneumonia.   Height: 5\' 4"  (162.6 cm) Weight: 245 lb 9.5 oz (111.4 kg) IBW/kg (Calculated) : 54.7  Temp (24hrs), Avg:91.3 F (32.9 C), Min:88 F (31.1 C), Max:97.7 F (36.5 C)  Recent Labs  Lab 02/16/18 0338 02/17/18 0334 02/17/18 2148 02/18/18 0108 02/18/18 0332 02/18/18 0740 02/18/18 0931 02/18/18 1305  WBC 19.8* 24.7* 24.6*  --  24.9* 23.7*  --   --   CREATININE 7.48* 8.21* 8.86*  --  9.02* 8.69* 8.04* 5.99*  LATICACIDVEN  --   --  4.0* 6.6* 9.9*  --   --   --     Estimated Creatinine Clearance: 11 mL/min (A) (by C-G formula based on SCr of 5.99 mg/dL (H)).    No Known Allergies  Antimicrobials this admission: Cefepime 11/26 >>  Vancomycin 11/29 >>  Metronidazole 11/29 >>  Dose adjustments this admission: N/A  Microbiology results: 11/29 BCx: no growth < 12 hours  11/25 MRSA PCR: negative  11/29 MRSA PCR: negative   Thank you for allowing pharmacy to be a part of this patient's care.  Simpson,Michael L 02/18/2018 4:07 PM

## 2018-02-18 NOTE — Consult Note (Signed)
[facility] GI Inpatient Consult Note   Kathline Magic, M.D.  Reason for Consult: Coffee ground emesis, anemia secondary to acute blood loss   Attending Requesting Consult: Cassandria Santee, M.D.  Outpatient Primary Physician: Lelon Huh, M.D.  History of Present Illness: Crystal Pacheco is a 67 y.o. female with a hx of obesity, GERD, Systolic and diastolic CHF, CAD s/p LHC with PCI and stent placement, STEMI who has persistent atrial fibrillation recently on Xarelto (held). Patient was admitted on 01/26/2018 for ARF presumably due to brisk loop diuretic therapy for edema of the legs in the outpatient setting. Since hospital admission, the patient subsequently has suffered an adverse clinical course culminating in severe oliguric renal failure with need for hemodialysis today, coagulopathy with INR of 10, hypotension requiring 3 pressors, and respiratory failure requiring sedation and mechanical ventilation.  I was called to see the patient by Dr. Soyla Murphy, the attending intensivist, secondary to the presence of approximately 800cc of coffee ground material suctioned after endotracheal intubation of the patient this morning.   [name], Gwyndolyn Guilford, is present at the bedside and says the patient has recently become increasingly confused and lethargic, presumably as the result of uremia associated with severe renal dysfunction.  Past Medical History:  Past Medical History:  Diagnosis Date  . Anemia   . Anxiety   . Cerebral infarction ([place]) 2006  . Chronic combined systolic (congestive) and diastolic (congestive) heart failure ([place])    a. 08/2005 MV: EF 42%; b. 01/2018 Echo: EF 45-50%. Mild to mod MR.  Marland Kitchen COPD (chronic obstructive pulmonary disease) ([place])   . Coronary artery disease 2006   a. 01/2005 PCI: DES->OM. RCA 100p. Complicated by groin PSA; b 08/2005 MV: EF 42%, anterior and high lateral scar w/ peri-inf ischemia.  . Essential hypertension   . GERD (gastroesophageal  reflux disease)   . MI (myocardial infarction) ([place]) 2006  . Persistent atrial fibrillation    a. Dx 01/2018. CHA2DS2VASc = 8-->Xarelto.  . Type II diabetes mellitus ([place])     Problem List: Patient Active Problem List   Diagnosis Date Noted  . Acute heart failure with preserved ejection fraction (HFpEF) ([place])   . ARF (acute renal failure) ([place]) 01/26/2018  . Atrial fibrillation with RVR ([place])   . Chronic kidney disease (CKD), active medical management without dialysis, stage 3 (moderate) ([place]) 01/31/2018  . Hyperuricemia 05/20/2017  . Eczema 08/25/2016  . Diabetes mellitus with nephropathy ([place]) 02/20/2015  . Absolute anemia 11/09/2014  . Anxiety 11/09/2014  . Atherosclerosis of coronary artery 11/09/2014  . Congestive heart failure ([place]) 11/09/2014  . History of measles, mumps, or rubella 11/09/2014  . Chronic airway obstruction ([place]) 11/09/2014  . Cerebral artery occlusion with cerebral infarction ([place]) 11/09/2014  . Breath shortness 11/09/2014  . Accumulation of fluid in tissues 11/09/2014  . Aneurysm of artery of lower extremity ([place]) 11/09/2014  . Anxiety, generalized 11/09/2014  . Esophageal reflux 11/09/2014  . Hemorrhage of gastrointestinal tract 11/09/2014  . History of disease of blood and blood-forming organs 11/09/2014  . HLD (hyperlipidemia) 11/09/2014  . BP (high blood pressure) 11/09/2014  . Laceration of perineum 11/09/2014  . Coronary atherosclerosis of unspecified type of vessel, native or graft 11/14/2013  . Other fatigue 11/14/2013  . Bilateral edema of lower extremity 11/14/2013  . Morbid obesity ([place]) 11/14/2013  . Shortness of breath 11/14/2013  . Essential hypertension 11/14/2013  . Prediabetes 11/14/2013  . Elevated brain natriuretic peptide (BNP) level 11/14/2013  . Smoker 11/14/2013  .  Tubular adenoma of colon 01/21/2006    Past Surgical History: Past Surgical History:  Procedure Laterality Date  . CARDIAC CATHETERIZATION    . CARDIOVERSION  N/A 02/18/2018   Procedure: CARDIOVERSION;  Surgeon: Wellington Hampshire, MD;  Location: [facility] ORS;  Service: Cardiovascular;  Laterality: N/A;  . Carotid surgery stent    . CORONARY ANGIOPLASTY WITH STENT PLACEMENT  02/10/2005   Paclitaxel Eluting Taxus Express 2 Monorail 2.75 x 3m left Cx-marg.  .Marland KitchenNM MYOVIEW LTD     Myoview EF= 42% High lateral wall defect   . TEE WITHOUT CARDIOVERSION N/A 01/27/2018   Procedure: TRANSESOPHAGEAL ECHOCARDIOGRAM (TEE);  Surgeon: AWellington Hampshire MD;  Location: [facility] ORS;  Service: Cardiovascular;  Laterality: N/A;    Allergies: No Known Allergies  Home Medications: Medications Prior to Admission  Medication Sig Dispense Refill Last Dose  . allopurinol (ZYLOPRIM) 100 MG tablet Take 1 tablet (100 mg total) by mouth daily. 90 tablet 4 02/13/2018 at Unknown time  . colchicine 0.6 MG tablet Take 2 tablets today, then one daily until gout is gone (Patient taking differently: Take 0.6 mg by mouth daily as needed (for gout flares). ) 30 tablet 1 Taking  . diphenhydrAMINE (BENADRYL) 25 mg capsule Take 25 mg by mouth 2 (two) times daily.   02/16/2018 at Unknown time  . metFORMIN (GLUCOPHAGE-XR) 500 MG 24 hr tablet TAKE 1 TABLET BY MOUTH EVERY DAY WITH BREAKFAST (Patient taking differently: Take 500 mg by mouth 3 (three) times a week. TAKE 1 TABLET BY MOUTH EVERY DAY WITH BREAKFAST) 90 tablet 3 Taking  . metoprolol tartrate (LOPRESSOR) 25 MG tablet Take 2 tablets (50 mg total) by mouth 2 (two) times daily. (Patient taking differently: Take 50 mg by mouth daily. ) 3 tablet 3 02/12/2018 at Unknown time  . NITROSTAT 0.4 MG SL tablet DISSOLVE 1 TABLET UNDER TONGUE EVERY 3 TO 5 MINUTES AS NEEDED FOR CHEST PAIN 25 tablet 0 Taking  . omeprazole (PRILOSEC) 20 MG capsule TAKE 1 CAPSULE BY MOUTH TWO TIMES DAILY (Patient taking differently: Take 20 mg by mouth 2 (two) times daily as needed (for acid reflux/indigestion.). ) 180 capsule 1 Taking  . potassium chloride SA  (K-DUR,KLOR-CON) 20 MEQ tablet Take 1 tablet (20 mEq total) by mouth daily. 30 tablet 1 01/25/2018 at Unknown time  . rivaroxaban (XARELTO) 20 MG TABS tablet Take 1 tablet (20 mg total) by mouth daily with supper. 30 tablet 3 02/13/2018 at Unknown time  . torsemide (DEMADEX) 100 MG tablet Take 1 tablet (100 mg total) by mouth daily. 30 tablet 0 Taking  . ALPRAZolam (XANAX) 0.25 MG tablet TAKE 1/2-1 TABLET BY MOUTH TWICE DAILY AS NEEDED (Patient not taking: Reported on 01/31/2018) 180 tablet 1 Not Taking at Unknown time  . atorvastatin (LIPITOR) 80 MG tablet TAKE 1 TABLET EVERY DAY (Patient not taking: Reported on 01/27/2018) 90 tablet 4 Not Taking at Unknown time  . glucose blood test strip Use as instructed 100 each 12 Taking   Home medication reconciliation was completed with the patient.   Scheduled Inpatient Medications:   . sodium chloride   Intravenous Once  . albuterol      . aspirin EC  81 mg Oral Daily  . chlorhexidine gluconate (MEDLINE KIT)  15 mL Mouth Rinse BID  . Chlorhexidine Gluconate Cloth  6 each Topical Q0600  . docusate sodium  100 mg Oral BID  . feeding supplement (NEPRO CARB STEADY)  237 mL Oral BID BM  . fentaNYL (SUBLIMAZE)  injection  100 mcg Intravenous Once  . hydrocortisone sod succinate (SOLU-CORTEF) inj  100 mg Intravenous Q8H  . insulin aspart  0-9 Units Subcutaneous Q4H  . mouth rinse  15 mL Mouth Rinse 10 times per day  . metoprolol tartrate  37.5 mg Oral Q6H  . multivitamin with minerals  1 tablet Oral Daily  . [START ON 02/21/2018] pantoprazole  40 mg Intravenous Q12H  . patiromer  8.4 g Oral Daily  . rosuvastatin  10 mg Oral q1800    Continuous Inpatient Infusions:   . sodium chloride 10 mL/hr at 02/18/18 0009  . ceFEPime (MAXIPIME) IV Stopped (02/17/18 1801)  . epinephrine 0.5 mcg/min (02/18/18 1400)  . fentaNYL infusion INTRAVENOUS Stopped (02/18/18 0846)  . metronidazole Stopped (02/18/18 5284)  . norepinephrine (LEVOPHED) Adult infusion 45  mcg/min (02/18/18 1240)  . pantoprozole (PROTONIX) infusion 8 mg/hr (02/18/18 0911)  . phenylephrine (NEO-SYNEPHRINE) Adult infusion 400 mcg/min (02/18/18 1424)  . phytonadione (VITAMIN K) IV    . prothrombin complex conc human (Kcentra) IVPB    . pureflow 2,000 mL/hr at 02/18/18 1015  .  sodium bicarbonate  infusion 1000 mL 125 mL/hr at 02/18/18 1400  . vasopressin (PITRESSIN) infusion - *FOR SHOCK* 0.04 Units/min (02/18/18 1400)    PRN Inpatient Medications:  acetaminophen **OR** acetaminophen, artificial tears, bisacodyl, fentaNYL, heparin, ipratropium-albuterol, midazolam, midazolam  Family History: family history includes CAD in her mother.   GI Family History: Negative  Social History:   reports that she quit smoking about 10 years ago. Her smoking use included cigarettes. She has a 20.00 pack-year smoking history. She has never used smokeless tobacco. She reports that she drinks about 2.0 standard drinks of alcohol per week. She reports that she does not use drugs. The patient denies ETOH, tobacco, or drug use.    Review of Systems: Review of Systems - unobtainable secondary to patient incapacity  Physical Examination: BP 92/63   Pulse (!) 40   Temp (!) 89.2 F (31.8 C)   Resp 19   Ht 5' 4"  (1.626 m)   Wt 111.4 kg   SpO2 (!) 77%   BMI 42.16 kg/m  Physical Exam  Constitutional: She appears well-developed and well-nourished. She appears toxic. She appears ill. She is sedated, chemically paralyzed and intubated.  HENT:  Head: Normocephalic and atraumatic.  Eyes: Pupils are equal, round, and reactive to light. Conjunctivae and lids are normal.  Neck: Trachea normal. Neck supple. Decreased carotid pulses present. Carotid bruit is not present. No thyromegaly present.  Cardiovascular: An irregularly irregular rhythm present. Exam reveals no gallop.  Pulmonary/Chest: No stridor. She is intubated. She has wheezes. She has rales. She exhibits no tenderness.  Abdominal: Soft.  She exhibits distension. She exhibits no mass. Bowel sounds are decreased. There is no tenderness. There is no rigidity and no guarding.  Neurological: She is unresponsive.  Skin: Capillary refill takes 2 to 3 seconds. No rash noted.  Psychiatric: She is inattentive.    Data: Lab Results  Component Value Date   WBC 23.7 (H) 02/18/2018   HGB 7.7 (L) 02/18/2018   HCT 26.7 (L) 02/18/2018   MCV 102.2 (H) 02/18/2018   PLT 154 02/18/2018   Recent Labs  Lab 02/18/18 0539 02/18/18 0740 02/18/18 1158  HGB 9.5* 9.5* 7.7*   Lab Results  Component Value Date   NA 136 02/18/2018   K 5.3 (H) 02/18/2018   CL 99 02/18/2018   CO2 12 (L) 02/18/2018   BUN 96 (H) 02/18/2018  CREATININE 5.99 (H) 02/18/2018   GLU 119 10/27/2013   Lab Results  Component Value Date   ALT 31 02/17/2018   AST 125 (H) 02/17/2018   ALKPHOS 203 (H) 02/17/2018   BILITOT 3.1 (H) 02/17/2018   Recent Labs  Lab 02/18/18 0108  02/18/18 0740  APTT 39*  --   --   INR 7.65*   < > 10.49*   < > = values in this interval not displayed.   CBC Latest Ref Rng & Units 02/18/2018 02/18/2018 02/18/2018  WBC 4.0 - 10.5 K/uL - 23.7(H) -  Hemoglobin 12.0 - 15.0 g/dL 7.7(L) 9.5(L) 9.5(L)  Hematocrit 36.0 - 46.0 % 26.7(L) 32.7(L) 31.8(L)  Platelets 150 - 400 K/uL - 154 -    STUDIES: Dg Chest 1 View  Result Date: 02/18/2018 CLINICAL DATA:  Hypoxia.  Central catheter placement EXAM: CHEST  1 VIEW COMPARISON:  February 18, 2018 study obtained earlier in the day. FINDINGS: Endotracheal tube tip is 2.3 cm above the carina. Central catheter tip is in the superior vena cava near the cavoatrial junction. Nasogastric tube tip and side port are below the diaphragm. No pneumothorax. There is consolidation throughout much of the left mid and lower lung zones with left pleural effusion. There is a small right pleural effusion with right base atelectasis. Heart is mildly enlarged with pulmonary vascularity normal. No adenopathy. There is  aortic atherosclerosis. No bone lesions. IMPRESSION: Tube and catheter positions as described without pneumothorax. Central catheter in particular has its tip in the superior vena cava near the cavoatrial junction. Persistent consolidation throughout much of the left mid and lower lung zones with left pleural effusion. Small right pleural effusion with right base atelectasis. No new opacity evident. Stable cardiac silhouette. There is aortic atherosclerosis. Aortic Atherosclerosis (ICD10-I70.0). Electronically Signed   By: Lowella Grip III M.D.   On: 02/18/2018 07:36   Dg Abd 1 View  Result Date: 02/18/2018 CLINICAL DATA:  OG tube placement. EXAM: ABDOMEN - 1 VIEW COMPARISON:  Radiographs earlier this day. FINDINGS: Tip and side port of the enteric tube below the diaphragm in the stomach. Decreased gaseous gastric distension from earlier this day. Persistent air-filled transverse colon and bowel loops in the lower abdomen. IMPRESSION: Tip and side port of the enteric tube below the diaphragm in the stomach. Electronically Signed   By: Keith Rake M.D.   On: 02/18/2018 04:36   Dg Abd 1 View  Result Date: 02/18/2018 CLINICAL DATA:  Abdominal pain. EXAM: ABDOMEN - 1 VIEW COMPARISON:  None. FINDINGS: Gaseous gastric distension. Gaseous distension of transverse colon, with air-filled bowel in the lower abdomen, colon versus small bowel. No evidence of free air. Soft tissue attenuation from habitus limits assessment. IMPRESSION: Gaseous gastric distension. Gaseous distension of transverse colon, with air-filled bowel in the lower abdomen, colon versus small bowel. Findings suggest generalized ileus. Electronically Signed   By: Keith Rake M.D.   On: 02/18/2018 02:56   Dg Chest Port 1 View  Result Date: 02/18/2018 CLINICAL DATA:  Status post intubation. EXAM: PORTABLE CHEST 1 VIEW COMPARISON:  Radiograph yesterday. FINDINGS: Endotracheal tube tip 2.6 cm from the carina. Enteric tube in place  with tip and side-port below the diaphragm. Progressive rotation from prior exam. Persistent opacification of lower 1/2 of left hemithorax. Worsening right infrahilar opacities. Mediastinal contours and heart size are obscured by adjacent pleural effusion and rotation. IMPRESSION: 1. Endotracheal tube tip 2.6 cm from the carina. Tip and side port of the enteric tube below  the diaphragm. 2. Rotated exam with unchanged left lung base opacity and pleural effusion. Worsening right infrahilar opacities may be atelectasis, pneumonia, or aspiration in the appropriate clinical setting. Electronically Signed   By: Keith Rake M.D.   On: 02/18/2018 04:35   Dg Chest Port 1 View  Result Date: 02/17/2018 CLINICAL DATA:  Cough. EXAM: PORTABLE CHEST 1 VIEW COMPARISON:  Radiograph yesterday. CT 2 days ago. FINDINGS: Unchanged opacification of lower left hemithorax, combination of consolidation and pleural effusion. Left upper lobe and right infrahilar opacities are unchanged. Unchanged slight improvement in right pleural effusion, size not well characterized. No new airspace disease or pneumothorax. Unchanged heart size and mediastinal contours. Aortic atherosclerosis. IMPRESSION: 1. Unchanged opacification of lower left hemithorax, combination of consolidation and pleural effusion. 2. Unchanged left upper lobe and right infrahilar opacities. 3.  Aortic Atherosclerosis (ICD10-I70.0). Electronically Signed   By: Keith Rake M.D.   On: 02/17/2018 21:17   @IMAGES @  Assessment: 1. Acute upper GI bleeding - coffee ground material. DDx includes mucosal bleeding due to severe coagulopathy, mallory weiss tear, ischemic gastropathy secondary to possible non-occlusive mesenteric ischemia, PUD, gastritis, etc.   2. Severe coagulopathy. DDx includes xarelto toxicity, liver failure, sepsis, drug-drug interactions, etc.  3. Atrial fibrillation with RVR - on rate control medications.  4. Severe hypotensive crisis - on  multiple pressors.  5. Renal failure with hyperkalemia - prognosis guarded. On CRRT per nephrology.   6. Systolic and diastolic heart failure.  Recommendations:  1. Continue IV pantoprazole.  2. Reversal of coagulopathy.  3. Endoscopy will be undertaken when clinically feasible. Unfortunately, the coagulopathy prevents endoscopy given inherent risk of worsening bleeding through invasive procedures.   4. I discussed my findings and opinion with the patient's husband, who voiced understanding.  5. Will continue to monitor clinical course closely.  Thank you for the consult. Please call with questions or concerns.  Olean Ree, "Lanny Hurst MD [facility] [place], [place] [address] [phone]  02/18/2018 3:00 PM

## 2018-02-18 NOTE — Progress Notes (Signed)
[place], [place] 02/18/18  Subjective:   Patient remains critically ill.  BUN/creatinine are further increased to 125/8.7 Urine output remains minimal Was transferred to ICU last night and is now intubated, requiring pressors Severely acidotic with high potassium Dialysis cathter could not be placed yesterday because of high risk of bleeding  Objective:  Vital signs in last 24 hours:  Temp:  [97.6 F (36.4 C)-97.7 F (36.5 C)] 97.7 F (36.5 C) (11/29 0400) Pulse Rate:  [25-131] 38 (11/29 0650) Resp:  [16-28] 19 (11/29 0830) BP: (41-120)/(17-100) 57/25 (11/29 0820) SpO2:  [67 %-100 %] 67 % (11/29 0650) FiO2 (%):  [50 %-70 %] 50 % (11/29 0726) Weight:  [111.4 kg] 111.4 kg (11/29 0233)  Weight change: -0.2 kg Filed Weights   02/17/18 0439 02/18/18 0038 02/18/18 0233  Weight: 111.6 kg 111.4 kg 111.4 kg    Intake/Output:    Intake/Output Summary (Last 24 hours) at 02/18/2018 7322 Last data filed at 02/18/2018 0801 Gross per 24 hour  Intake 3071.05 ml  Output -  Net 3071.05 ml    Physical Exam: General:  Morbidly obese lady, lying in the bed  HEENT  ETT in place  Neck:  Short, thick,    Lungs:  Limited exam, b/l crackles,vent assisted  Heart::  Tachycardic, irregular, atrial fibrillation  Abdomen:  Soft, distended  Extremities:  2+ edema  Neurologic:  not responding to verbal or tactile stimuli  Skin:  Dry skin      Basic Metabolic Panel:  Recent Labs  Lab 02/15/18 0306 02/16/18 0338 02/17/18 0334 02/17/18 2148 02/18/18 0332 02/18/18 0740  NA 132* 132* 131* 129* 130* 133*  K 4.5 5.1 5.4* 6.0* 6.7* 5.6*  CL 90* 91* 88* 87* 90* 92*  CO2 23 20* 20* 18* 12* 13*  GLUCOSE 104* 142* 169* 163* 125* 137*  BUN 99* 107* 119* 137* 129* 125*  CREATININE 7.21* 7.48* 8.21* 8.86* 9.02* 8.69*  CALCIUM 8.6* 8.6* 9.0 9.3 9.1 8.4*  MG 2.1  --   --   --   --  3.2*  PHOS 7.1*  --   --   --   --  9.6*     CBC: Recent Labs  Lab  02/16/18 0338 02/17/18 0334 02/17/18 2148 02/18/18 0332 02/18/18 0539 02/18/18 0740  WBC 19.8* 24.7* 24.6* 24.9*  --  23.7*  NEUTROABS 17.3*  --  22.0*  --   --  PENDING  HGB 12.6 13.3 13.8 13.0 9.5* 9.5*  HCT 39.8 42.2 44.0 43.6 31.8* 32.7*  MCV 93.0 92.3 93.2 98.0  --  102.2*  PLT 265 278 304 240  --  154     No results found for: HEPBSAG, HEPBSAB, HEPBIGM    Microbiology:  Recent Results (from the past 240 hour(s))  MRSA PCR Screening     Status: None   Collection Time: 01/29/2018 10:06 AM  Result Value Ref Range Status   MRSA by PCR NEGATIVE NEGATIVE Final    Comment:        The GeneXpert MRSA Assay (FDA approved for NASAL specimens only), is one component of a comprehensive MRSA colonization surveillance program. It is not intended to diagnose MRSA infection nor to guide or monitor treatment for MRSA infections. Performed at [facility], [address] [place], [place] [address]   Culture, blood (Routine X 2) w Reflex to ID Panel     Status: None (Preliminary result)   Collection Time: 02/18/18  1:08 AM  Result Value  Ref Range Status   Specimen Description BLOOD RIGHT ANTECUBITAL  Final   Special Requests   Final    BOTTLES DRAWN AEROBIC AND ANAEROBIC Blood Culture adequate volume   Culture   Final    NO GROWTH < 12 HOURS Performed at [facility], [address], [place], [place] [address]    Report Status PENDING  Incomplete  MRSA PCR Screening     Status: None   Collection Time: 02/18/18  1:24 AM  Result Value Ref Range Status   MRSA by PCR NEGATIVE NEGATIVE Final    Comment:        The GeneXpert MRSA Assay (FDA approved for NASAL specimens only), is one component of a comprehensive MRSA colonization surveillance program. It is not intended to diagnose MRSA infection nor to guide or monitor treatment for MRSA infections. Performed at [facility], [address] [place], [place] [address]   Culture, blood (Routine  X 2) w Reflex to ID Panel     Status: None (Preliminary result)   Collection Time: 02/18/18  3:31 AM  Result Value Ref Range Status   Specimen Description BLOOD BLOOD RIGHT ARM  Final   Special Requests   Final    BOTTLES DRAWN AEROBIC AND ANAEROBIC Blood Culture adequate volume   Culture   Final    NO GROWTH < 12 HOURS Performed at [facility], [address], [place], [place] [address]    Report Status PENDING  Incomplete    Coagulation Studies: Recent Labs    02/17/18 1037 02/17/18 1652 02/18/18 0108 02/18/18 0332 02/18/18 0740  LABPROT 71.7* 77.4* 63.4* 70.8* 81.0*  INR 8.96* 9.89* 7.65* 8.82* 10.49*    Urinalysis: Recent Labs    02/18/18 0459  COLORURINE AMBER*  LABSPEC 1.016  PHURINE 5.0  GLUCOSEU NEGATIVE  HGBUR SMALL*  BILIRUBINUR NEGATIVE  KETONESUR 5*  PROTEINUR NEGATIVE  NITRITE NEGATIVE  LEUKOCYTESUR MODERATE*      Imaging: Dg Chest 1 View  Result Date: 02/18/2018 CLINICAL DATA:  Hypoxia.  Central catheter placement EXAM: CHEST  1 VIEW COMPARISON:  February 18, 2018 study obtained earlier in the day. FINDINGS: Endotracheal tube tip is 2.3 cm above the carina. Central catheter tip is in the superior vena cava near the cavoatrial junction. Nasogastric tube tip and side port are below the diaphragm. No pneumothorax. There is consolidation throughout much of the left mid and lower lung zones with left pleural effusion. There is a small right pleural effusion with right base atelectasis. Heart is mildly enlarged with pulmonary vascularity normal. No adenopathy. There is aortic atherosclerosis. No bone lesions. IMPRESSION: Tube and catheter positions as described without pneumothorax. Central catheter in particular has its tip in the superior vena cava near the cavoatrial junction. Persistent consolidation throughout much of the left mid and lower lung zones with left pleural effusion. Small right pleural effusion with right base atelectasis. No new  opacity evident. Stable cardiac silhouette. There is aortic atherosclerosis. Aortic Atherosclerosis (ICD10-I70.0). Electronically Signed   By: Lowella Grip III M.D.   On: 02/18/2018 07:36   Dg Abd 1 View  Result Date: 02/18/2018 CLINICAL DATA:  OG tube placement. EXAM: ABDOMEN - 1 VIEW COMPARISON:  Radiographs earlier this day. FINDINGS: Tip and side port of the enteric tube below the diaphragm in the stomach. Decreased gaseous gastric distension from earlier this day. Persistent air-filled transverse colon and bowel loops in the lower abdomen. IMPRESSION: Tip and side port of the enteric tube below the diaphragm  in the stomach. Electronically Signed   By: Keith Rake M.D.   On: 02/18/2018 04:36   Dg Abd 1 View  Result Date: 02/18/2018 CLINICAL DATA:  Abdominal pain. EXAM: ABDOMEN - 1 VIEW COMPARISON:  None. FINDINGS: Gaseous gastric distension. Gaseous distension of transverse colon, with air-filled bowel in the lower abdomen, colon versus small bowel. No evidence of free air. Soft tissue attenuation from habitus limits assessment. IMPRESSION: Gaseous gastric distension. Gaseous distension of transverse colon, with air-filled bowel in the lower abdomen, colon versus small bowel. Findings suggest generalized ileus. Electronically Signed   By: Keith Rake M.D.   On: 02/18/2018 02:56   Dg Chest Port 1 View  Result Date: 02/18/2018 CLINICAL DATA:  Status post intubation. EXAM: PORTABLE CHEST 1 VIEW COMPARISON:  Radiograph yesterday. FINDINGS: Endotracheal tube tip 2.6 cm from the carina. Enteric tube in place with tip and side-port below the diaphragm. Progressive rotation from prior exam. Persistent opacification of lower 1/2 of left hemithorax. Worsening right infrahilar opacities. Mediastinal contours and heart size are obscured by adjacent pleural effusion and rotation. IMPRESSION: 1. Endotracheal tube tip 2.6 cm from the carina. Tip and side port of the enteric tube below the  diaphragm. 2. Rotated exam with unchanged left lung base opacity and pleural effusion. Worsening right infrahilar opacities may be atelectasis, pneumonia, or aspiration in the appropriate clinical setting. Electronically Signed   By: Keith Rake M.D.   On: 02/18/2018 04:35   Dg Chest Port 1 View  Result Date: 02/17/2018 CLINICAL DATA:  Cough. EXAM: PORTABLE CHEST 1 VIEW COMPARISON:  Radiograph yesterday. CT 2 days ago. FINDINGS: Unchanged opacification of lower left hemithorax, combination of consolidation and pleural effusion. Left upper lobe and right infrahilar opacities are unchanged. Unchanged slight improvement in right pleural effusion, size not well characterized. No new airspace disease or pneumothorax. Unchanged heart size and mediastinal contours. Aortic atherosclerosis. IMPRESSION: 1. Unchanged opacification of lower left hemithorax, combination of consolidation and pleural effusion. 2. Unchanged left upper lobe and right infrahilar opacities. 3.  Aortic Atherosclerosis (ICD10-I70.0). Electronically Signed   By: Keith Rake M.D.   On: 02/17/2018 21:17     Medications:   . sodium chloride 10 mL/hr at 02/18/18 0009  . albumin human    . ceFEPime (MAXIPIME) IV Stopped (02/17/18 1801)  . fentaNYL infusion INTRAVENOUS 50 mcg/hr (02/18/18 0801)  . metronidazole 100 mL/hr at 02/18/18 0801  . norepinephrine (LEVOPHED) Adult infusion 40 mcg/min (02/18/18 0754)  . pantoprozole (PROTONIX) infusion 8 mg/hr (02/18/18 0845)  . phenylephrine (NEO-SYNEPHRINE) Adult infusion 400 mcg/min (02/18/18 0900)  .  sodium bicarbonate  infusion 1000 mL 75 mL/hr at 02/18/18 0801  . vasopressin (PITRESSIN) infusion - *FOR SHOCK*     . sodium chloride   Intravenous Once  . sodium chloride   Intravenous Once  . albuterol      . aspirin EC  81 mg Oral Daily  . chlorhexidine gluconate (MEDLINE KIT)  15 mL Mouth Rinse BID  . Chlorhexidine Gluconate Cloth  6 each Topical Q0600  . docusate sodium  100  mg Oral BID  . feeding supplement (NEPRO CARB STEADY)  237 mL Oral BID BM  . fentaNYL (SUBLIMAZE) injection  100 mcg Intravenous Once  . insulin aspart  0-5 Units Subcutaneous QHS  . insulin aspart  0-9 Units Subcutaneous TID WC  . mouth rinse  15 mL Mouth Rinse 10 times per day  . metoprolol tartrate  37.5 mg Oral Q6H  . multivitamin with minerals  1 tablet Oral Daily  . [START ON 02/21/2018] pantoprazole  40 mg Intravenous Q12H  . patiromer  8.4 g Oral Daily  . rosuvastatin  10 mg Oral q1800   acetaminophen **OR** acetaminophen, bisacodyl, fentaNYL, ipratropium-albuterol, midazolam, midazolam, ondansetron **OR** ondansetron (ZOFRAN) IV, polyvinyl alcohol, promethazine, traZODone  Assessment/ Plan:  67 y.o. female with  medical problems of chronic systolic CHF, COPD, coronary disease with history of angioplasty, hypertension, atrial fibrillation, diabetes type 2, non-insulin-dependent, who was admitted to [facility] on 02/05/2018  2D echo November 18: LVEF 45 to 50%, moderate mitral regurgitation  1.  Acute kidney injury, baseline creatinine 0.97 from January 31, 2018 2.  Generalized edema 3.  Atrial fibrillation 4.  Hyperkalemia 5.  Acute respiratory failure 6.  Severe acidosis  Acute kidney injury is likely secondary to overdiuresis causing ATN Urine output remains minimal  Plan: Patient is now critically ill, intubated and is hypotensive requiring multiple pressors.  She is hyperkalemic and severely acidotic.  Dialysis catheter has been placed in the IJ.  We will start hemodialysis in the form of CRRT.  Discussed with patient's husband.  He agrees to proceed.     LOS: 4 Kamarri Lovvorn 11/29/20199:06 AM  [place], [place]  Note: This note was prepared with Dragon dictation. Any transcription errors are unintentional

## 2018-02-18 NOTE — Progress Notes (Signed)
   02/18/18 1700  Clinical Encounter Type  Visited With Patient and family together  Visit Type Initial;Spiritual support;Patient actively dying  Referral From Chaplain Loletta Specter)  Spiritual Encounters  Spiritual Needs Grief support;Emotional;Prayer  Stress Factors  Family Stress Factors Other (Comment) (Patient's EOL)   Chaplain met with the patient's husband, daughter, and granddaughter during an initial encounter. The patient is Crystal Pacheco but attends a [employer] Pache[employer]. Her husband is Crystal Pacheco. Chaplain made introductions, provided emotional support, and offered pastoral presence and prayer (spoken and energetic). Subtle family tension is present centering around the daughter. Chaplain will return as needed.

## 2018-02-18 NOTE — Progress Notes (Signed)
Dr Jannifer Franklin made aware of lactic acid level.  New orders given.

## 2018-02-19 ENCOUNTER — Inpatient Hospital Stay: Payer: Medicare Other

## 2018-02-19 LAB — PREPARE FRESH FROZEN PLASMA
Unit division: 0
Unit division: 0
Unit division: 0

## 2018-02-19 LAB — CBC WITH DIFFERENTIAL/PLATELET
Abs Immature Granulocytes: 0.82 10*3/uL — ABNORMAL HIGH (ref 0.00–0.07)
Basophils Absolute: 0.1 10*3/uL (ref 0.0–0.1)
Basophils Relative: 1 %
EOS ABS: 0 10*3/uL (ref 0.0–0.5)
EOS PCT: 0 %
HCT: 24.5 % — ABNORMAL LOW (ref 36.0–46.0)
Hemoglobin: 7.2 g/dL — ABNORMAL LOW (ref 12.0–15.0)
Immature Granulocytes: 9 %
Lymphocytes Relative: 12 %
Lymphs Abs: 1.1 10*3/uL (ref 0.7–4.0)
MCH: 28.8 pg (ref 26.0–34.0)
MCHC: 29.4 g/dL — ABNORMAL LOW (ref 30.0–36.0)
MCV: 98 fL (ref 80.0–100.0)
Monocytes Absolute: 0.6 10*3/uL (ref 0.1–1.0)
Monocytes Relative: 7 %
Neutro Abs: 6.6 10*3/uL (ref 1.7–7.7)
Neutrophils Relative %: 71 %
PLATELETS: 65 10*3/uL — AB (ref 150–400)
RBC: 2.5 MIL/uL — ABNORMAL LOW (ref 3.87–5.11)
RDW: 21.1 % — ABNORMAL HIGH (ref 11.5–15.5)
Smear Review: NORMAL
WBC: 9.2 10*3/uL (ref 4.0–10.5)
nRBC: 43.2 % — ABNORMAL HIGH (ref 0.0–0.2)

## 2018-02-19 LAB — RENAL FUNCTION PANEL
Albumin: 2.5 g/dL — ABNORMAL LOW (ref 3.5–5.0)
Albumin: 2 g/dL — ABNORMAL LOW (ref 3.5–5.0)
Anion gap: 29 — ABNORMAL HIGH (ref 5–15)
Anion gap: 24 — ABNORMAL HIGH (ref 5–15)
BUN: 51 mg/dL — ABNORMAL HIGH (ref 8–23)
BUN: 42 mg/dL — ABNORMAL HIGH (ref 8–23)
CALCIUM: 6.4 mg/dL — AB (ref 8.9–10.3)
CO2: 10 mmol/L — ABNORMAL LOW (ref 22–32)
CO2: 14 mmol/L — ABNORMAL LOW (ref 22–32)
CREATININE: 2.6 mg/dL — AB (ref 0.44–1.00)
Calcium: 6.7 mg/dL — ABNORMAL LOW (ref 8.9–10.3)
Chloride: 97 mmol/L — ABNORMAL LOW (ref 98–111)
Chloride: 98 mmol/L (ref 98–111)
Creatinine, Ser: 3.23 mg/dL — ABNORMAL HIGH (ref 0.44–1.00)
GFR calc Af Amer: 16 mL/min — ABNORMAL LOW (ref >60)
GFR calc Af Amer: 21 mL/min — ABNORMAL LOW (ref >60)
GFR calc non Af Amer: 18 mL/min — ABNORMAL LOW (ref >60)
GFR, EST NON AFRICAN AMERICAN: 14 mL/min — AB (ref >60)
Glucose, Bld: 191 mg/dL — ABNORMAL HIGH (ref 70–99)
Glucose, Bld: 219 mg/dL — ABNORMAL HIGH (ref 70–99)
PHOSPHORUS: 7.6 mg/dL — AB (ref 2.5–4.6)
Phosphorus: 7.7 mg/dL — ABNORMAL HIGH (ref 2.5–4.6)
Potassium: 4.7 mmol/L (ref 3.5–5.1)
Potassium: 5.3 mmol/L — ABNORMAL HIGH (ref 3.5–5.1)
Sodium: 136 mmol/L (ref 135–145)
Sodium: 136 mmol/L (ref 135–145)

## 2018-02-19 LAB — BPAM FFP
Blood Product Expiration Date: 201912032359
Blood Product Expiration Date: 201912032359
Blood Product Expiration Date: 201912042359
Blood Product Expiration Date: 201912042359
ISSUE DATE / TIME: 201911281832
ISSUE DATE / TIME: 201911282156
ISSUE DATE / TIME: 201911291019
ISSUE DATE / TIME: 201911291400
Unit Type and Rh: 7300
Unit Type and Rh: 7300
Unit Type and Rh: 7300
Unit Type and Rh: 7300

## 2018-02-19 LAB — BPAM CRYOPRECIPITATE
Blood Product Expiration Date: 201911300040
Blood Product Expiration Date: 201911300040
ISSUE DATE / TIME: 201911291936
ISSUE DATE / TIME: 201911291936
Unit Type and Rh: 6200
Unit Type and Rh: 6200

## 2018-02-19 LAB — PROCALCITONIN: Procalcitonin: 1.72 ng/mL

## 2018-02-19 LAB — MAGNESIUM
MAGNESIUM: 2.1 mg/dL (ref 1.7–2.4)
Magnesium: 2.2 mg/dL (ref 1.7–2.4)

## 2018-02-19 LAB — HEPATITIS B CORE ANTIBODY, IGM: Hep B C IgM: NEGATIVE

## 2018-02-19 LAB — BPAM PLATELET PHERESIS
Blood Product Expiration Date: 201911302359
ISSUE DATE / TIME: 201911292047
Unit Type and Rh: 6200

## 2018-02-19 LAB — PREPARE PLATELET PHERESIS: Unit division: 0

## 2018-02-19 LAB — PREPARE CRYOPRECIPITATE
Unit division: 0
Unit division: 0

## 2018-02-19 LAB — HEPATITIS C ANTIBODY (REFLEX)

## 2018-02-19 LAB — HEPATITIS B SURFACE ANTIBODY,QUALITATIVE: Hep B S Ab: NONREACTIVE

## 2018-02-19 LAB — PREPARE RBC (CROSSMATCH)

## 2018-02-19 LAB — LACTIC ACID, PLASMA
Lactic Acid, Venous: 22.4 mmol/L (ref 0.5–1.9)
Lactic Acid, Venous: 20.9 mmol/L (ref 0.5–1.9)

## 2018-02-19 LAB — GLUCOSE, CAPILLARY
GLUCOSE-CAPILLARY: 193 mg/dL — AB (ref 70–99)
Glucose-Capillary: 189 mg/dL — ABNORMAL HIGH (ref 70–99)

## 2018-02-19 LAB — HCV COMMENT:

## 2018-02-19 LAB — PROTIME-INR
INR: >10
Prothrombin Time: >90 seconds — ABNORMAL HIGH (ref 11.4–15.2)

## 2018-02-19 LAB — HEPATITIS B SURFACE ANTIGEN: Hepatitis B Surface Ag: POSITIVE — AB

## 2018-02-19 MED ORDER — ACETAMINOPHEN 325 MG PO TABS: 650.0000 mg | ORAL_TABLET | Freq: Four times a day (QID) | ORAL | Status: DC | PRN

## 2018-02-19 MED ORDER — HALOPERIDOL LACTATE 2 MG/ML PO CONC: 0.5000 mg | ORAL | Status: DC | PRN

## 2018-02-19 MED ORDER — VITAMIN K1 10 MG/ML IJ SOLN
10.0000 mg | Freq: Once | INTRAVENOUS | Status: DC
  Filled 2018-02-19: qty 1

## 2018-02-19 MED ORDER — HALOPERIDOL 0.5 MG PO TABS
0.5000 mg | ORAL_TABLET | ORAL | Status: DC | PRN
  Filled 2018-02-19: qty 1

## 2018-02-19 MED ORDER — ACETAMINOPHEN 650 MG RE SUPP: 650.0000 mg | Freq: Four times a day (QID) | RECTAL | Status: DC | PRN

## 2018-02-19 MED ORDER — GLYCOPYRROLATE 1 MG PO TABS: 1.0000 mg | ORAL_TABLET | ORAL | Status: DC | PRN

## 2018-02-19 MED ORDER — GLYCOPYRROLATE 0.2 MG/ML IJ SOLN: 0.2000 mg | INTRAMUSCULAR | Status: DC | PRN

## 2018-02-19 MED ORDER — CALCIUM GLUCONATE-NACL 1-0.675 GM/50ML-% IV SOLN
1.0000 g | Freq: Once | INTRAVENOUS | Status: DC
  Filled 2018-02-19: qty 50

## 2018-02-19 MED ORDER — SODIUM CHLORIDE 0.9% IV SOLUTION: Freq: Once | INTRAVENOUS | Status: DC

## 2018-02-19 MED ORDER — SODIUM BICARBONATE 8.4 % IV SOLN: 50.0000 meq | Freq: Once | INTRAVENOUS | Status: DC

## 2018-02-19 MED ORDER — ONDANSETRON HCL 4 MG/2ML IJ SOLN: 4.0000 mg | Freq: Four times a day (QID) | INTRAMUSCULAR | Status: DC | PRN

## 2018-02-19 MED ORDER — POLYVINYL ALCOHOL 1.4 % OP SOLN: 1.0000 [drp] | Freq: Four times a day (QID) | OPHTHALMIC | Status: DC | PRN

## 2018-02-19 MED ORDER — BIOTENE DRY MOUTH MT LIQD: 15.0000 mL | OROMUCOSAL | Status: DC | PRN

## 2018-02-19 MED ORDER — HALOPERIDOL LACTATE 5 MG/ML IJ SOLN: 0.5000 mg | INTRAMUSCULAR | Status: DC | PRN

## 2018-02-19 MED ORDER — PROTHROMBIN COMPLEX CONC HUMAN 500 UNITS IV KIT: 5000.0000 [IU] | PACK | Status: DC

## 2018-02-19 MED ORDER — ONDANSETRON 4 MG PO TBDP: 4.0000 mg | ORAL_TABLET | Freq: Four times a day (QID) | ORAL | Status: DC | PRN

## 2018-02-20 LAB — BPAM RBC
BLOOD PRODUCT EXPIRATION DATE: 201912202359
Blood Product Expiration Date: 201912102359
Blood Product Expiration Date: 201912202359
Blood Product Expiration Date: 201912202359
Blood Product Expiration Date: 201912202359
ISSUE DATE / TIME: 201911300215
ISSUE DATE / TIME: 201911291310
ISSUE DATE / TIME: 201911292149
Unit Type and Rh: 7300
Unit Type and Rh: 7300
Unit Type and Rh: 7300
Unit Type and Rh: 7300
Unit Type and Rh: 7300

## 2018-02-20 LAB — TYPE AND SCREEN
ABO/RH(D): B POS
Antibody Screen: NEGATIVE
Unit division: 0
Unit division: 0
Unit division: 0
Unit division: 0
Unit division: 0

## 2018-02-20 NOTE — Progress Notes (Signed)
Chaplin came to desk and asked if husband was called to come in. [place] daughter called husband to return to bedside. Patient condition has remained the same since beginning of the shift. Blood pressure improved, but is fluctuating. Remains afib on the monitor. No output in [place]. Continue on CRRT and four pressors that are maxed out.

## 2018-02-20 NOTE — Progress Notes (Signed)
   2018/03/14 0700  Clinical Encounter Type  Visited With Patient and family together  Visit Type Follow-up;Spiritual support;ED  Recommendations Continuing follow-up.  Spiritual Encounters  Spiritual Needs Grief support;Emotional;Prayer   Continued care. Discussed EOL process with Gershon Mussel and family. Patient is actively dying. Gershon Mussel knows the medical issues, but is indecisive to date. Chaplain provided emotional and spiritual support while they discussed removing life-prolonging measures.

## 2018-02-20 NOTE — Progress Notes (Signed)
   2018/02/28 0800  Clinical Encounter Type  Visited With Patient and family together  Visit Type Follow-up;Patient actively dying  Consult/Referral To Chaplain ([place])  Spiritual Encounters  Spiritual Needs Emotional;Grief support;Prayer  Stress Factors  Family Stress Factors Other (Comment) (Patient's end of life.)   Tom and family are moving forward with extubation. Chaplain has transferred care to [facility].

## 2018-02-20 NOTE — Progress Notes (Signed)
   03/08/18 0840  Clinical Encounter Type  Visited With Family;Patient and family together  Visit Type Initial;Spiritual support;Death;Patient actively dying  Referral From Chaplain;Nurse  Consult/Referral To [place] Needs Prayer;Emotional;Grief support   CH proceeded with patient care after Hart finished his on-call tour. I provided pastoral presences and active listening. [name] along with the patient's daughter and 2 grand-children were present. The patient died within minutes of patient becoming comfort care. I assisted the family in gathering the patient's belongings and to complete the funeral release form. The family departed 40 minutes after Crystal Pacheco's death.

## 2018-02-20 NOTE — Progress Notes (Signed)
Patient was extubated to nonrebreather per Dr. Soyla Murphy.

## 2018-02-20 NOTE — Progress Notes (Signed)
Patient condition continued to decline.   Currently she is on epinephrine drip + norepinephrine drip + vasopressin drip + Neo-Synephrine drip.   On CVVHD off sedation, comatose, pupils are dilated nonreactive, no corneal, no gag, no doll's, no DTR, no spontaneous movement, and she is not triggering the ventilator. DIC picture  I met with the family, they were made aware of the patient condition and poor outcome.  Family requested to change her CODE STATUS to DNR and moved to withdrawal support and starting comfort measures. Patient expired after starting comfort measures.  Family was notified at the bedside.

## 2018-02-20 NOTE — Discharge Summary (Signed)
Name: Crystal Pacheco MRN: [id number] DOB: 1950/07/08     CONSULTATION DATE: 02/09/2018   HISTORY OF PRESENT ILLNESS:  67 years old lady with history of morbid obesity, atrial fibrillation on Xarelto for anticoagulation, coronary artery disease, congestive heart failure, hypertension, dyslipidemia, COPD, diabetes mellitus and CVA.  Patient was scheduled to have TEE/DCCV for atrial fibrillation on 02/18/2018.  The procedure has to be put on hold because of acute kidney injury with creatinine up to 6.8.  During the course of hospital admission the patient condition continues to deteriorate and she was transferred back to ICU with worsening renal failure, hypotension and altered mental status.  While in the unit she has to be intubated, will be started on CVVHD with worsening renal failure, pressors because of septic/cardiogenic shock, developed a DIC picture and coma with toxic metabolic encephalopathy. Patient condition continued to deteriorate, family requested to withdraw support and starting comfort measures.  Patient explained on 02/19/2089 PAST MEDICAL HISTORY :   has a past medical history of Anemia, Anxiety, Cerebral infarction ([place]) (2006), Chronic combined systolic (congestive) and diastolic (congestive) heart failure ([place]), COPD (chronic obstructive pulmonary disease) ([place]), Coronary artery disease (2006), Essential hypertension, GERD (gastroesophageal reflux disease), MI (myocardial infarction) ([place]) (2006), Persistent atrial fibrillation, and Type II diabetes mellitus ([place]).  has a past surgical history that includes NM MYOVIEW LTD; Cardiac catheterization; Coronary angioplasty with stent (02/10/2005); Carotid surgery stent; TEE without cardioversion (N/A, 02/07/2018); and CARDIOVERSION (N/A, 02/02/2018). Prior to Admission medications   Medication Sig Start Date End Date Taking? Authorizing Provider  allopurinol (ZYLOPRIM) 100 MG tablet Take 1 tablet (100 mg total) by mouth daily.  05/20/17  Yes Birdie Sons, MD  colchicine 0.6 MG tablet Take 2 tablets today, then one daily until gout is gone Patient taking differently: Take 0.6 mg by mouth daily as needed (for gout flares).  05/19/17  Yes Birdie Sons, MD  diphenhydrAMINE (BENADRYL) 25 mg capsule Take 25 mg by mouth 2 (two) times daily.   Yes [provider]  metFORMIN (GLUCOPHAGE-XR) 500 MG 24 hr tablet TAKE 1 TABLET BY MOUTH EVERY DAY WITH BREAKFAST Patient taking differently: Take 500 mg by mouth 3 (three) times a week. TAKE 1 TABLET BY MOUTH EVERY DAY WITH BREAKFAST 05/19/17  Yes Birdie Sons, MD  metoprolol tartrate (LOPRESSOR) 25 MG tablet Take 2 tablets (50 mg total) by mouth 2 (two) times daily. Patient taking differently: Take 50 mg by mouth daily.  01/31/18  Yes Fisher, Kirstie Peri, MD  NITROSTAT 0.4 MG SL tablet DISSOLVE 1 TABLET UNDER TONGUE EVERY 3 TO 5 MINUTES AS NEEDED FOR CHEST PAIN 11/12/14  Yes Birdie Sons, MD  omeprazole (PRILOSEC) 20 MG capsule TAKE 1 CAPSULE BY MOUTH TWO TIMES DAILY Patient taking differently: Take 20 mg by mouth 2 (two) times daily as needed (for acid reflux/indigestion.).  05/21/17  Yes Birdie Sons, MD  potassium chloride SA (K-DUR,KLOR-CON) 20 MEQ tablet Take 1 tablet (20 mEq total) by mouth daily. 02/03/18  Yes Birdie Sons, MD  rivaroxaban (XARELTO) 20 MG TABS tablet Take 1 tablet (20 mg total) by mouth daily with supper. 02/10/18  Yes Birdie Sons, MD  torsemide (DEMADEX) 100 MG tablet Take 1 tablet (100 mg total) by mouth daily. 01/31/18  Yes Birdie Sons, MD  ALPRAZolam Duanne Moron) 0.25 MG tablet TAKE 1/2-1 TABLET BY MOUTH TWICE DAILY AS NEEDED Patient not taking: Reported on 01/23/2018 05/21/17   Birdie Sons, MD  atorvastatin (LIPITOR)  80 MG tablet TAKE 1 TABLET EVERY DAY Patient not taking: Reported on 02/18/2018 05/20/17   Birdie Sons, MD  glucose blood test strip Use as instructed 11/14/14   Birdie Sons, MD   No Known  Allergies  FAMILY HISTORY:  family history includes CAD in her mother. SOCIAL HISTORY:  reports that she quit smoking about 10 years ago. Her smoking use included cigarettes. She has a 20.00 pack-year smoking history. She has never used smokeless tobacco. She reports that she drinks about 2.0 standard drinks of alcohol per week. She reports that she does not use drugs.  REVIEW OF SYSTEMS:   Unable to obtain due to critical illness   VITAL SIGNS: Temp:  [82.4 F (28 C)-93.4 F (34.1 C)] 82.4 F (28 C) (11/30 0900) Pulse Rate:  [25-116] 102 (11/30 0315) Resp:  [7-30] 7 (11/30 0900) BP: (35-125)/(13-104) 85/61 (11/30 0800) SpO2:  [0 %-91 %] 91 % (11/30 0315) FiO2 (%):  [60 %-70 %] 70 % (11/30 0730) Weight:  [423 kg] 126 kg (11/30 0500)  Physical Examination:  Coma, dilated nonreactive pupils, no corneal, no gag, no doll's, no DTR, no spontaneous movements, and she is not triggering the ventilator On vent, no distress, air entry on left less than right with no adventitious sounds S1 and S2 are audible with no murmur Benign obese abdomen with feeble peristalsis Wasted extremities with no peripheral edema  Course of admission included:  *Acute respiratory failure with mild respiratory distress.  Intubated on 02/18/2018 *COPD *Pneumonia with aspiration. Bibasilar airspace diease Lt. Rt MRSA PCR -ve  *CT CHEST 11/26: Bil airspace disease with bil. pl. Eff. Questionable Lt hilar mass  And possible multiple liver lesions with concern for malignancy -Monitor ABG and optimize vent settings -Bronchodilators, Cefep + Flagyl -CT chest with iv contrast with improvement of renal function.  *Altered mental status (coma) With toxic metabolic encephalopathy.  -Monitor neuro status and CT head when stable  AKI, hyperkalemia, worsening metabolic acidosis with anuria due to ATN -RRT as per renal -Temporizing agent for hyperkalemia -Bicarb gtt -Monitor renal panel  *A. fib with RVR.  Echo 02/07/2018 LVEF 45 to 50%.Patient was scheduled to have TEE/DCCV on 02/02/2018 and has set on holdbecause of serum creatinine 6.87GFR 6.  *Coronary artery disease *CHF with bilateral leg edema. Abdomen LVEF 45 to 50% -Optimize rate control and monitor hemodynamics -Management as per cardiology  *Sepsis with septic shock. Procal 4.31, LA 9.9 -Cefep + Falgyl , Follow with culture  -Monitor hemodynamics, cultures and Procalcitonin  *GI bleed.  700 of coffee-ground aspirate via orogastric *Elevated LFT with acute ischemic hepatitis -PPI -GI consult discussed with Dr. Alice Reichert  *Anemia with blood loss -Maintain hemoglobin more than 9 g/dL.  Liberal transfusion policy was active GI bleed and hypotension  *Coagulopathy due to Xarelto. *DIC -Kcentra and monitor coagulation profile  *Diabetes mellitus. HB A 1 C 7.8 -Glycemic control  *Gouty arthritis and limited physical capacity being only able to go to the bathroom -Allopurinol and optimize analgesia  DVT & GI prophylaxis.

## 2018-02-20 NOTE — Progress Notes (Signed)
Per family decision and icu md order, pt will transition to comfort care.

## 2018-02-20 NOTE — Progress Notes (Signed)
   03-17-2018 0600  Clinical Encounter Type  Visited With Patient and family together  Visit Type Follow-up;Spiritual support  Recommendations Follow-up when paged.  Spiritual Encounters  Spiritual Needs Grief support;Emotional;Prayer  Stress Factors  Family Stress Factors  (Patient's EOL.)   Chaplain met with the daughter and patient and offered continued emotional and spiritual support. Chaplain offered to keep a close watch as the patient's health declines. Chaplain offered a prayer of spiritual preparation for the patient in anticipation of her EOL.

## 2018-02-20 DEATH — deceased

## 2018-02-21 ENCOUNTER — Telehealth: Payer: Self-pay

## 2018-02-21 LAB — GLUCOSE, CAPILLARY: Glucose-Capillary: 154 mg/dL — ABNORMAL HIGH (ref 70–99)

## 2018-02-21 NOTE — Telephone Encounter (Signed)
Death certificate received and put in Dr. Zoila Shutter box for signature.

## 2018-02-21 NOTE — Telephone Encounter (Signed)
Recieved Death Certificate from ___[employer] _______ Delivered/Placed _____nurse box _______

## 2018-02-22 NOTE — Telephone Encounter (Signed)
Funeral home contacted.

## 2018-02-23 LAB — CULTURE, BLOOD (ROUTINE X 2)
Culture: NO GROWTH
Culture: NO GROWTH
Special Requests: ADEQUATE
Special Requests: ADEQUATE

## 2018-02-25 ENCOUNTER — Ambulatory Visit: Payer: Medicare Other

## 2018-03-03 LAB — QUANTIFERON-TB GOLD PLUS

## 2018-03-18 ENCOUNTER — Ambulatory Visit: Payer: Medicare Other | Admitting: Nurse Practitioner

## 2020-05-06 IMAGING — DX DG CHEST 1V
1 series · 1 of 1 positions shown · non-contrast
Comparison: February 18, 2018 study obtained earlier in the day.

CLINICAL DATA: Hypoxia.  Central catheter placement

EXAM:
CHEST  1 VIEW

[chest ap]
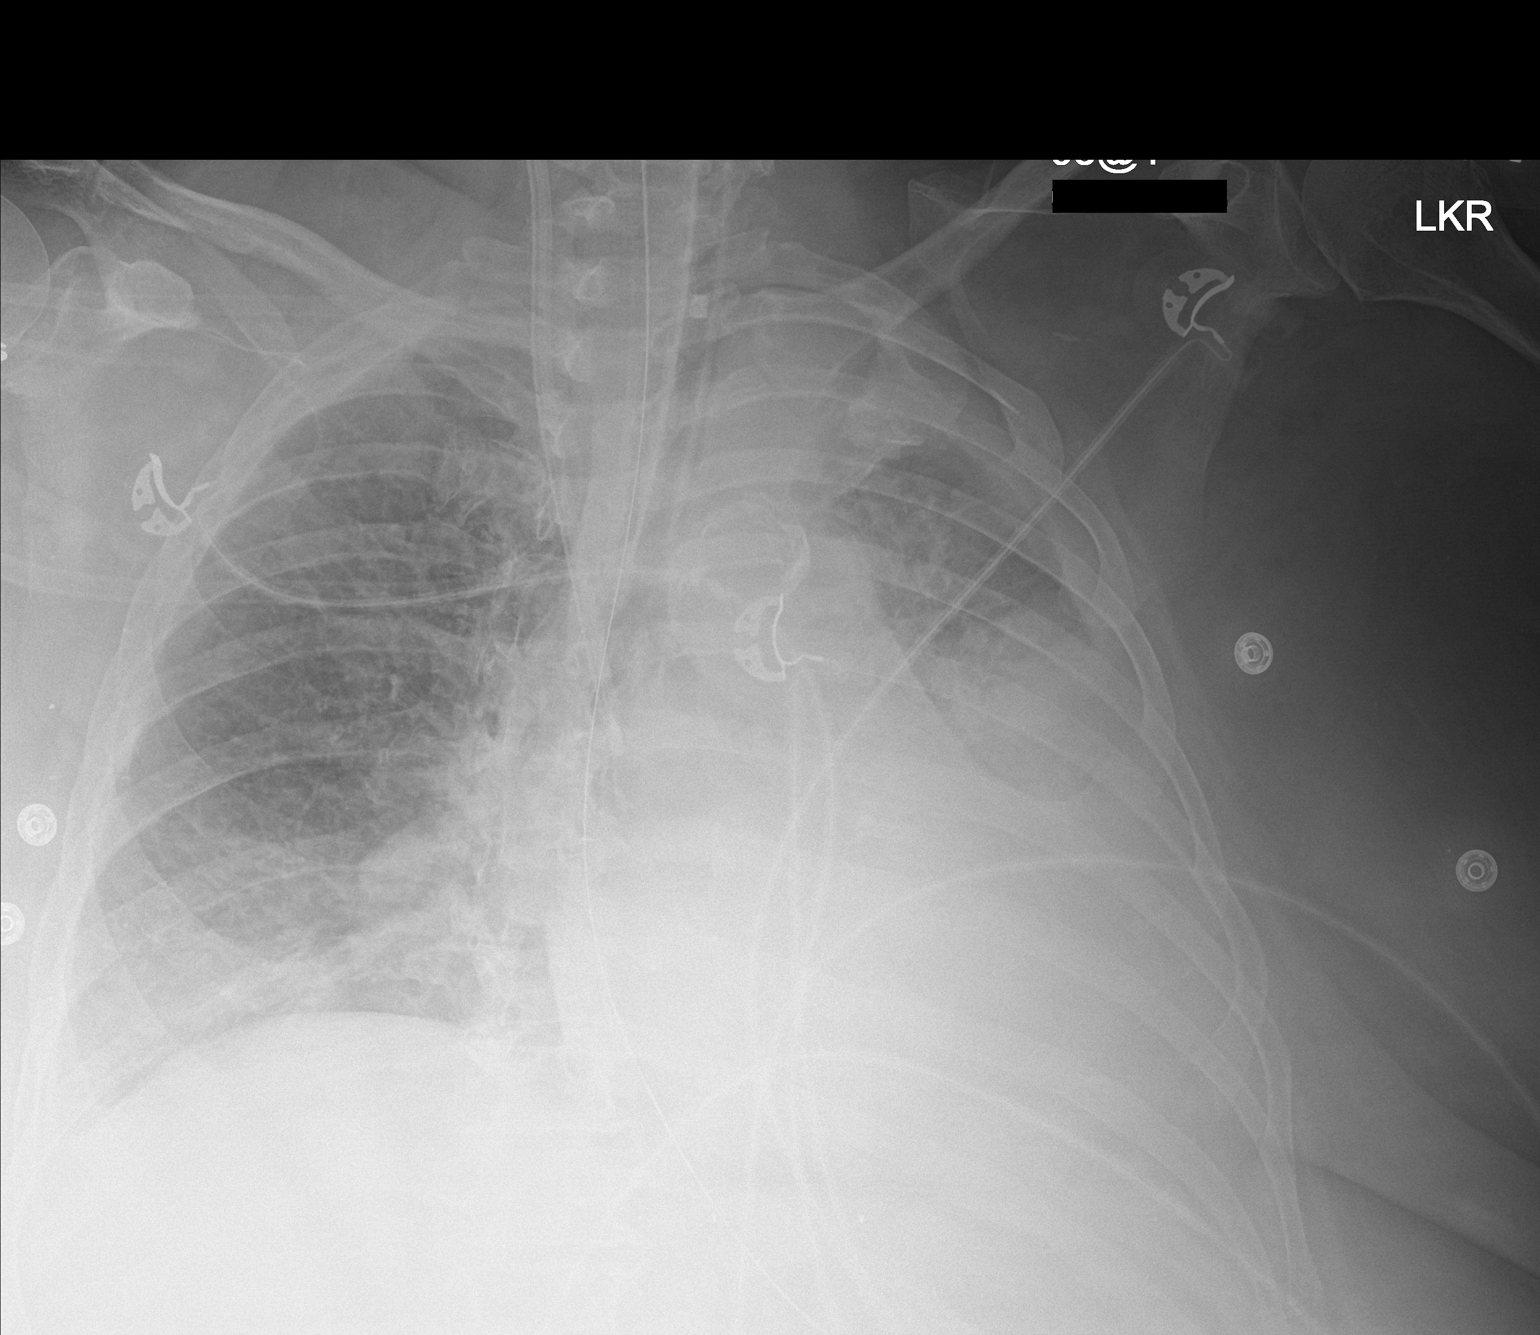

[1 of 1 positions shown; findings below may reference images not displayed]

FINDINGS: Endotracheal tube tip is 2.3 cm above the carina. Central catheter
tip is in the superior vena cava near the cavoatrial junction.
Nasogastric tube tip and side port are below the diaphragm. No
pneumothorax. There is consolidation throughout much of the left mid
and lower lung zones with left pleural effusion. There is a small
right pleural effusion with right base atelectasis. Heart is mildly
enlarged with pulmonary vascularity normal. No adenopathy. There is
aortic atherosclerosis. No bone lesions.
IMPRESSION: Tube and catheter positions as described without pneumothorax.
Central catheter in particular has its tip in the superior vena cava
near the cavoatrial junction. Persistent consolidation throughout
much of the left mid and lower lung zones with left pleural
effusion. Small right pleural effusion with right base atelectasis.
No new opacity evident. Stable cardiac silhouette. There is aortic
atherosclerosis.

Aortic Atherosclerosis ([id number]).
